# Patient Record
Sex: Male | Born: 1945 | Race: White | Hispanic: No | Marital: Married | Smoking: Former smoker
Health system: Southern US, Community
[De-identification: ages and names within clinical notes are randomized; demographics above are authoritative.]

## PROBLEM LIST (undated history)

## (undated) DIAGNOSIS — I739 Peripheral vascular disease, unspecified: Secondary | ICD-10-CM

## (undated) DIAGNOSIS — I209 Angina pectoris, unspecified: Secondary | ICD-10-CM

## (undated) DIAGNOSIS — M1990 Unspecified osteoarthritis, unspecified site: Secondary | ICD-10-CM

## (undated) DIAGNOSIS — I10 Essential (primary) hypertension: Secondary | ICD-10-CM

## (undated) DIAGNOSIS — K219 Gastro-esophageal reflux disease without esophagitis: Secondary | ICD-10-CM

## (undated) HISTORY — PX: JOINT REPLACEMENT: SHX530

## (undated) HISTORY — PX: HERNIA REPAIR: SHX51

## (undated) HISTORY — PX: APPENDECTOMY: SHX54

---

## 1980-02-22 HISTORY — PX: FOOT SURGERY: SHX648

## 1983-02-22 HISTORY — PX: FOOT SURGERY: SHX648

## 2000-03-16 ENCOUNTER — Encounter: Admission: RE | Admit: 2000-03-16 | Discharge: 2000-04-06 | Payer: Self-pay | Admitting: Neurosurgery

## 2003-01-09 ENCOUNTER — Inpatient Hospital Stay (HOSPITAL_COMMUNITY): Admission: EM | Admit: 2003-01-09 | Discharge: 2003-01-10 | Payer: Self-pay

## 2006-04-26 ENCOUNTER — Ambulatory Visit: Payer: Self-pay | Admitting: Cardiology

## 2006-04-26 ENCOUNTER — Observation Stay (HOSPITAL_COMMUNITY): Admission: EM | Admit: 2006-04-26 | Discharge: 2006-04-27 | Payer: Self-pay | Admitting: Emergency Medicine

## 2006-05-15 ENCOUNTER — Ambulatory Visit: Payer: Self-pay | Admitting: Cardiology

## 2006-06-27 ENCOUNTER — Ambulatory Visit: Payer: Self-pay | Admitting: Cardiology

## 2007-12-28 ENCOUNTER — Ambulatory Visit: Payer: Self-pay | Admitting: Cardiology

## 2007-12-29 ENCOUNTER — Encounter: Payer: Self-pay | Admitting: Cardiology

## 2008-05-19 ENCOUNTER — Ambulatory Visit: Payer: Self-pay | Admitting: Cardiology

## 2008-06-04 ENCOUNTER — Ambulatory Visit: Payer: Self-pay | Admitting: Cardiology

## 2008-11-24 DIAGNOSIS — E785 Hyperlipidemia, unspecified: Secondary | ICD-10-CM | POA: Insufficient documentation

## 2008-11-24 DIAGNOSIS — R002 Palpitations: Secondary | ICD-10-CM | POA: Insufficient documentation

## 2008-11-24 DIAGNOSIS — I10 Essential (primary) hypertension: Secondary | ICD-10-CM

## 2010-04-26 ENCOUNTER — Encounter: Payer: BC Managed Care – PPO | Attending: Family Medicine

## 2010-04-26 DIAGNOSIS — Z713 Dietary counseling and surveillance: Secondary | ICD-10-CM | POA: Insufficient documentation

## 2010-04-26 DIAGNOSIS — E119 Type 2 diabetes mellitus without complications: Secondary | ICD-10-CM | POA: Insufficient documentation

## 2010-05-11 ENCOUNTER — Ambulatory Visit: Payer: Medicare Other

## 2010-06-23 ENCOUNTER — Encounter (INDEPENDENT_AMBULATORY_CARE_PROVIDER_SITE_OTHER): Payer: Self-pay | Admitting: Internal Medicine

## 2010-07-06 NOTE — Assessment & Plan Note (Signed)
[facility]                          [facility] NOTE   NAME:[name], [name] STARLIPER                       MRN:          [id number]  DATE:12/28/2007                            DOB:          [birth year]    PRIMARY CARDIOLOGIST:  Learta Codding, MD,FACC   REASON FOR VISIT:  Annual followup.   [name] returns to our clinic since last seen here in May 2008.  He was  doing quite well until these past few weeks when he noted development of  recurrent night sweats, absent of any associated fever, and with no  associated chest discomfort.  During the daytime hours, he also has  noted development of easy fatigability when he is piddling around.  He  has not taken any nitroglycerin tablets for these episodes, which are  correlated with some mild anterior chest tightness.  However, he also  suggests new-onset fluttering in the lower part of his sternum, which  is associated with the daytime episodes of weakness.  He does not note  any such symptoms when he is awakened by these profuse diaphoretic  episodes, however.  He also seems to suggest a progressive nature to  these episodes.  He is diabetic and states that his blood sugar levels  are, if anything, on the high side during these periods.   EKG in our office today indicates an SR with incomplete RBB.   CURRENT MEDICATIONS:  1. Toprol ER 50 daily.  2. Aspirin 325 daily.  3. Glipizide 10 b.i.d.  4. Avandamet 05/998 b.i.d.  5. Lipitor 10 nightly.   PHYSICAL EXAMINATION:  VITAL SIGNS:  Blood pressure 132/78, pulse 68 and  regular, and weight is 207.  GENERAL:  A 65 year old male, moderately obese, sitting upright, in no  distress.  HEENT:  Normocephalic and atraumatic.  NECK:  Palpable bilateral carotid pulses without bruits; no JVD.  LUNGS:  Clear to auscultation in all fields.  HEART:  Regular rate and rhythm.  Positive S4.  No significant murmurs.  No rubs.  ABDOMEN:  Soft and nontender.  Intact  bowel sounds.  EXTREMITIES:  Palpable bilateral femoral pulses with right femoral  bruit, none on the left.  Minimally palpable right dorsalis pedis pulse  with brisk left dorsalis pedis pulse.  No significant pedal edema.  NEUROLOGIC:  No focal deficit.   IMPRESSION:  1. New-onset tachypalpitations.      a.     Associated weakness and chest tightness.  2. Nocturnal diaphoretic episodes.      a.     No associated tachypalpitations/chest tightness.  3. Nonobstructive coronary artery disease.      a.     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  4. Normal left ventricle.  5. Hypertension.  6. Type 2 diabetes mellitus.  7. Dyslipidemia.  8. Remote tobacco.   PLAN:  1. Schedule CardioNet monitoring to exclude new-onset dysrhythmias.  2. Up titrate Toprol to 75 mg daily for possible anti-anginal benefit,      as well as suppression of these  palpitations.  3. Extensive blood work with complete metabolic profile,      CBC/differential, and TSH level.  4. Early clinic followup with myself and Dr. Andee Lineman in 1 month, for      review of study results and further recommendations.      Gene Serpe, PA-C  Electronically Signed      Learta Codding, MD,FACC  Electronically Signed   GS/MedQ  DD: 12/28/2007  DT: 12/29/2007  Job #: [id number]   cc:   Ace Gins, MD

## 2010-07-06 NOTE — Assessment & Plan Note (Signed)
[facility]                          [facility]ICE NOTE   NAME:[name]                       MRN:          [id number]  DATE:05/19/2008                            DOB:          Jul 20, 1945    HISTORY OF PRESENT ILLNESS:  The patient is a pleasant 65 year old male  with a history of tachy palpitations and nonobstructive coronary artery  disease by catheterization in 2008.  The patient had a CardioNet monitor  placed in December 2009, which showed no significant arrhythmias.  The  patient states that up until a week ago, he was doing, Friday, a week  ago, however, he woke up in the middle of the night with chest tightness  and with some pain in the left jaw area.  He was very nauseated and this  lasted until most of the morning.  The patient was not able to go to  work.  He states since this particular episode, he is still drained, and  very weak, and has no exercise tolerance anymore.  On exertion, now also  reports some left-sided neck and shoulder pain.  He has no orthopnea,  PND, palpitations, or syncope.   MEDICATIONS:  1. Toprol ER 50 mg p.o. daily.  2. Aspirin 325 daily.  3. Glipizide 10 mg p.o. b.i.d.  4. Avandamet 1000 mg p.o. b.i.d.  5. Lipitor 40 mg nightly.   PHYSICAL EXAMINATION:  VITAL SIGNS:  Blood pressure 160/85, heart rate  87, respiration 18, and weight 206.  GENERAL:  Well-nourished white male in no apparent distress.  HEENT:  Pupils, eyes are equal.  Conjunctivae clear.  NECK:  Supple.  Normal carotid upstroke with no carotid bruits.  LUNGS:  Clear breath sounds bilaterally.  HEART:  Regular rate and rhythm.  Normal S1 and S2.  No murmur, rubs, or  gallops.  ABDOMEN:  Soft and nontender.  No rebound or guarding.  Good bowel  sounds.  EXTREMITIES:  No cyanosis, clubbing, or edema.  NEUROLOGIC:  The patient is alert and oriented.  Grossly nonfocal.   PROBLEM LIST:  1. Nausea and left neck and shoulder pain.    a.     Rule out ischemic heart disease.      b.     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      c.     Normal left ventricular function.  2. History of tachy palpitations, resolved.  3. History of nocturnal diaphoretic episodes.  4. Hypertension.  5. Type 2 diabetes mellitus.  6. Dyslipidemia.  7. Remote tobacco abuse.   PLAN:  1. The patient has previously been evaluated on CardioNet monitor and      this did not show any dysrhythmias.  He did report in the past      diaphoretic episodes at night and this makes me concern he could      have an angina equivalent.  There is no new change in his  electrocardiogram, however.  2. Given the above, we will schedule the patient however, for an      exercise Cardiolite stress study and given prescription for Imdur      30 mg p.o. daily in the meanwhile.  3. The patient's blood pressure also significantly elevated.  Imdur      should help with this, but this may require further attention.      Also, we will repeat his blood pressure lying down before he      departs to the office.     Learta Codding, MD,FACC  Electronically Signed    GED/MedQ  DD: 05/19/2008  DT: 05/20/2008  Job #: [id number]   cc:   Marjory Lies, M.D.

## 2010-07-09 NOTE — Discharge Summary (Signed)
NAMEPEPE, MINEAU                          ACCOUNT NO.:  [id number]   MEDICAL RECORD NO.:  [id number]                   PATIENT TYPE:  INP   LOCATION:  4732                                 FACILITY:  [facility]   PHYSICIAN:  Rollene Rotunda, M.D.                DATE OF BIRTH:  07-09-1945   DATE OF ADMISSION:  01/09/2003  DATE OF DISCHARGE:  01/10/2003                                 DISCHARGE SUMMARY   PROCEDURE:  1. Cardiac catheterization.  2. Coronary arteriogram.  3. Left ventriculogram.   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  He reports a long history of exertional  chest pain, and at one point had nitroglycerin, but quit carrying this years  before.   On the day before admission, he had two episodes of chest pain, both less  than 30 minutes.  However, he had chest pain which woke him at approximately  2:30 a.m. and was associated with diaphoresis, shortness of breath, and  radiated to his left neck and arm.  It was a 10/10 at the worst, but lasted  less than five minutes and was described as a tightening.  He felt drained  this a.m., but had no chest pain upon waking.  However, the tightness  returned and his wife made him see his primary M.D. who sent him to the  emergency room by EMS.  Upon examination, he had some left arm pain, but no  chest pain.  His symptoms were concerning for unstable anginal pain and he  was admitted to rule out MI and for further evaluation.   His enzymes were negative for MI, but there was concern for unstable anginal  pain, so he was scheduled for cardiac catheterization which was performed on  January 10, 2003.   The cardiac catheterization showed no significant coronary artery disease,  but there was a myocardial bridge to  the LAD seen up to 80% stenosis and  approximately 30 mm.  His EF was greater than 55% with no MR.  Dr. Chales Abrahams  evaluated the films and felt that he needed to be on a beta blocker which  had been started and he needed a Cardiolite on medication.  If symptoms were  significant on the stress test, then he may be considered for minimally  invasive surgery of LIMA to LAD.  He was Perclosed.   Post catheterization, he had no chest pain or shortness of breath and his  Perclose dressing was without any hemorrhage.  There was no ecchymosis  around the catheterization site and no hematoma was noted.  He was  considered stable for discharge on January 10, 2003, and is to follow up as  an outpatient.  LABORATORY DATA:  Hemoglobin A1C is 11.7%.  Total cholesterol 155,  triglycerides 155, HDL 42, LDL 81.  TSH 1.072.  Sodium 137, potassium 4.1,  chloride 102, CO2 29, BUN 19, creatinine 1.3, glucose 308.  Other CMET  values within normal limits.  Hemoglobin 14.9, hematocrit 42.8, WBC 6.4,  platelets 196.   Chest x-ray; suboptimal inspiration, but no active disease.   CONDITION ON DISCHARGE:  Improved.   DISCHARGE DIAGNOSES:  1. Chest pain, possibly secondary to an 80% left anterior descending which     is secondary to a myocardial bridge.  2. Diabetes mellitus, hemoglobin A1C 11.7.  3. Intolerance to Rezulin.  4. Osteoarthritis.  5. History of a bulging disk in his back.  6. Status post right leg and ankle surgery as well as appendectomy and     hernia repair.  7. Family history of congestive heart failure in his father, but no history     of known coronary artery disease.  8. History of palpitations with no significant arrhythmias seen on strips     during this admission.   DISCHARGE INSTRUCTIONS:  1. His activity level is to include no strenuous activity for two days.  He     is to stick to a low fat, diabetic diet.  2. He is to call the office for problems with the catheterization  site.  3. He has a stress test scheduled on December 3, at 8:45 a.m. and is to     follow up with Dr. Antoine Poche on December 8, at 9:30 a.m.  He is to follow     up with Dr. Doristine Counter as needed.   DISCHARGE MEDICATIONS:  1. Aspirin 325 mg daily.  2. Avandamet 4/500 mg b.i.d. restart Sunday, November 21.  3. Univasc 15 mg 1/2 tablet daily.  4. Toprol XL 25 mg daily.  5. Nitroglycerin p.r.n.      Lavella Hammock, P.A. [facility]                  Rollene Rotunda, M.D.    RG/MEDQ  D:  01/10/2003  T:  01/11/2003  Job:  [id number]   cc:   Marjory Lies, M.D.  [address]  [place]  [place] [address]  Fax: [phone]   Rollene Rotunda, M.D.

## 2010-07-09 NOTE — Cardiovascular Report (Signed)
NAMESHIGERU, LAMPERT                ACCOUNT NO.:  [id number]   MEDICAL RECORD NO.:  [id number]          PATIENT TYPE:  INP   LOCATION:  2807                         FACILITY:  [facility]   PHYSICIAN:  Salvadore Farber, MD  DATE OF BIRTH:  January 09, 1946   DATE OF PROCEDURE:  04/26/2006  DATE OF DISCHARGE:                            CARDIAC CATHETERIZATION   PROCEDURE:  Left heart catheterization, left ventriculography, coronary  angiography, pressure wire interrogation of the left anterior descending  artery.   INDICATIONS:  Mr. Beedle is a 65 year old gentleman who underwent  diagnostic angiography approximately five years ago.  That demonstrated  a myocardial bridge with associated systolic compression of the LAD.  He  was prescribed beta blockade.  He did well for a number of years.  However, he has been recently noncompliant with his medical therapy.  He  now presents with an episode of chest discomfort.  Electrocardiogram is  normal.  Troponin was slightly elevated at 0.17.  He was therefore  referred for diagnostic angiography.   PROCEDURAL TECHNIQUE:  Informed consent was obtained.  Under 1%  lidocaine local anesthesia, a 5-French sheath was placed in the right  common femoral artery using the modified Seldinger technique.  Diagnostic angiography and ventriculography were performed using JL-4,  JR-4, and pigtail catheters.  This demonstrated 50% stenosis of the mid-  LAD and then a long 50% stenosis further downstream in the mid-LAD with  associated myocardial bridge.  There were no other significant stenoses.  Due to his syndrome, we decided to proceed with pressure wire  interrogation of the LAD.   To that end, the sheath was upsized over a wire to 6-French.  The  patient had been given Lovenox subcutaneously seven hours prior to the  procedure.  Therefore, no additional anticoagulation was administered.  A 6-French CLS 3.5 guide was advanced over a wire and engaged in the  ostium  of the left main.  The pressure wire was first positioned with  the pressure port just outside the guide.  Pressures were normalized.  I  then advanced the wire well beyond the segment in question, into the  distal LAD.  Two separate measurements of FFR were performed after the  administration of 42 mcg of intracoronary nitroglycerin.  The first  measured 0.82 and the second 0.83.  Wire was then removed as were  catheters.  The patient was then transferred to the holding room in  stable condition having tolerated the procedure well.   COMPLICATIONS:  None.   FINDINGS:  1. LV:  120/12/18.  EF 55% without regional wall motion abnormality.  2. No aortic stenosis or mitral regurgitation.  3. Left main coronary angiographically normal.  4. LAD:  Moderate-sized vessel giving rise to two diagonals.  There is      a 20% stenosis of the ostium of the LAD, a focal 50% stenosis in      the LAD at the site of origin of the diagonals and then a long 50%      stenosis of the mid vessel.  Some, but not all, of this longer  stenosis is associated with myocardial bridging.  The FFR distal to      all this was 0.83.  5. Ramus intermedius:  Small vessel which is angiographically normal.  6. Circumflex:  Moderate-sized vessel giving rise to two marginals.      It is angiographically normal.  7. RCA:  Fairly small though dominant vessel.  It is angiographically      normal.   IMPRESSION/PLAN:  The patient has nonobstructive coronary disease and a  myocardial bridge.  Despite these, the FFR is not significantly  abnormal.  Will treat him with beta blockade and aspirin.      Salvadore Farber, MD  Electronically Signed     WED/MEDQ  D:  04/26/2006  T:  04/26/2006  Job:  [id number]   cc:   Learta Codding, MD,FACC

## 2010-07-09 NOTE — Discharge Summary (Signed)
[name], [name]                ACCOUNT NO.:  [id number]   MEDICAL RECORD NO.:  [id number]          PATIENT TYPE:  OBV   LOCATION:  2010                         FACILITY:  [facility]   PHYSICIAN:  [name], ACNP  DATE OF BIRTH:  Feb 08, 1946   DATE OF ADMISSION:  04/26/2006  DATE OF DISCHARGE:  04/27/2006                               DISCHARGE SUMMARY   DISCHARGING PHYSICIAN:  [name], M.D.   PRIMARY CARDIOLOGIST:  [name], M.D.   PRIMARY CARE PHYSICIAN:  [name], M.D.   DISCHARGING PHYSICIAN:  1. Coronary artery disease status post cardiac catheterization showing      nonobstructive coronary artery disease with an intramyocardial      bridge with systolic compression of the left anterior descending.  2. Non-ST-elevation myocardial infarction this admission with a      troponin of 0.18 at [facility] prior to transfer      here.  3. Borderline diabetes with a glucose of 222 at [facility]      prior to transfer here.  Diabetic management to be followed by Dr.      Doristine [name] outpatient.   PAST MEDICAL HISTORY:  1. Previous cardiac catheterization in 1994 at [facility] which showed no critical CAD at that time but      intramyocardial bridge with systolic compression LAD.  2. A long history of exertional chest pain.  3. Most recent cardiac catheterization in 2004 prior to this      admission.  Ejection fraction at that time was noted to be 55%.      Cardiac catheterization showed no significant coronary artery      disease.  Once again, myocardial bridge to the LAD seen up to 80%      stenosis and approximately 30 mm.  4. Previous diagnosis of diabetes with a hemoglobin A1c of 11.7 in      2004.  5. Intolerance to Rezulin.  6. Osteoarthritis.  7. History of bulging disk.  8. Status post right leg and ankle surgery.  9. Status post appendectomy.  10.Status post hernia repair.  11.History of palpitations for  nonsignificant arrhythmias during      hospitalization.   PROCEDURES THIS ADMISSION:  Cardiac catheterization on April 26, 2006 by  [name].  Results as stated above.   HOSPITAL COURSE:  [name] is a 65 year old gentleman initially  presented to [facility] complaining of chest pain.  The  patient initially stated the chest pain had started on March 4.  They  used nitroglycerin with relief.  Chest discomfort started once again in  the evening with  radiation into his left arm described as heaviness.  They used nitroglycerin with minimal relief of discomfort.  Initial  laboratory work at [facility] showed a troponin of 0.18 and a CK-MB of  2.7.  Arrangements were made for the patient to be transferred to [facility] via [facility].   The patient was seen and noted to be hypotensive, given a fluid bolus,  started on nitroglycerin, and pain free.  The patient to the  catheterization laboratory after seen by [name].  Results as stated  above.  The patient tolerated the procedure without complications.   [name] in to see the patient on the day of discharge.  The patient  afebrile, blood pressure 112/73, no complaints.  Catheterization site  stable.  Hematocrit 40, BUN and creatinine 14 and 1.2, glucose elevated  at 214.  The patient being discharged home with instructions to follow  up with Dr. Andee [name] within the next two weeks.  He is to continue Toprol-  XL 50 mg daily.  The patient has a prescription for this, Lipitor 80 mg  at bedtime, prescription has been given.  Nitroglycerin p.r.n.,  prescription has been given.  He is also to take aspirin 325 mg daily  and follow up with [name] regarding his hyperglycemia with previous  diet control.  However, not therapeutic at this time.  The patient has  also been given the post cardiac catheterization discharge instructions  and medication information following a non-ST-elevation MI.   DURATION OF DISCHARGE ENCOUNTER:   Thirty minutes.      [name], ACNP     MB/MEDQ  D:  04/27/2006  T:  04/27/2006  Job:  [id number]   cc:   [name], M.D.

## 2010-07-09 NOTE — Cardiovascular Report (Signed)
NAME:  [name], [name]                          ACCOUNT NO.:  [id number]   MEDICAL RECORD NO.:  [id number]                   PATIENT TYPE:  INP   LOCATION:  4732                                 FACILITY:  [facility]   PHYSICIAN:  Veneda Melter, M.D.                   DATE OF BIRTH:  [birth year]   DATE OF PROCEDURE:  01/10/2003  DATE OF DISCHARGE:                              CARDIAC CATHETERIZATION   PROCEDURES PERFORMED:  1. Left heart catheterization.  2. Left ventriculogram.  3. Selective coronary.  4. Perclose, right femoral artery.   DIAGNOSES:  1. Intramyocardial bridge, mid left anterior descending.  2. Mild coronary artery disease.  3. Normal left ventricular systolic function.   CARDIOLOGIST:  Veneda Melter, M.D.   HISTORY:  [name] is a 65 year old gentleman who presents with substernal  chest discomfort occurring at rest.  The patient was admitted ot the  hospital, stabilized medically and ruled out for acute myocardial  infarction.  He presents now for further assessment.   TECHNIQUE:  Informed consent was obtained.  The patient was brought to the  catheterization lab.  A 6 French sheath was placed in the right femoral  artery using the modified Seldinger technique.  Six [place] JL-4 and JR-4  catheters were then used to engage the left and right coronary arteries.  Selective coronary angiography was performed in various projections using  manual injection of contrast.  A 6 French pigtail catheter was then advanced  into the left ventricle and left ventriculogram performed using power  injection of contrast.   At the termination of the case catheters and sheaths were removed, and a  Perclose suture closure device deployed to the right femoral artery until  adequate hemostasis was achieved.   The patient tolerated the procedure well and was transferred to the floor in  stable condition.   FINDINGS:  Findings are as follows.  1. Left Main Trunk:  The left main trunk is a  large caliber vessel with mild     irregularities.   1. Left Anterior Descending:  LAD; this begins as a large caliber vessel     and tapers in its midsection, it is then of small caliber as it     approaches the apex.  The LAD provides two diagonal branches in the     proximal segment.  The second diagonal branch has an ostial narrowing of     50%.  The mid LAD then has a long intramyocardial bridge of approximately     30 mm, which appears to compromise the lumen by approximately 70-80%.   1. Left Circumflex Artery:  The left circumflex artery begins as a large     caliber vessel and provides two mg; branches in the midsection.  The     circumflex system has luminal irregularities.   1. Ramus Intermedius:  The ramus intermedius is a small caliber vessel  that     tapers in the mid anterolateral wall.  There is an ostial narrowing of     70%.  This is a small caliber vessel.   1. Right Coronary Artery:  The right coronary artery is dominant small     caliber vessel that provides a diminutive posterior descending artery in     its terminal segment.  The right coronary has mild irregularities.   1. Left Ventricle:  Normal end-systolic and end-diastolic dimensions.     Overall left ventricular function is well preserved.  Ejection fraction     is greater than 55%.  No mitral regurgitation.  LV pressure is 90/10.     Aortic is 90/70.  LVEDP is 15.   ASSESSMENT/PLAN:  [name] is a 65 year old gentleman with mild coronary  artery disease.  He has a significant intramyocardial bridge in the mid left  anterior descending with overall well preserved left ventricular function.   Aggressive medical therapy will be pursued with beta blockers and calcium  channel blocker.  Stress imaging study on maximal therapy may be beneficial  to determine if he has any ischemia in the anterior or apical region.  If he  continues to have symptoms and demonstrable ischemia surgical  revascularization will  be indicated, otherwise we will continue medical  therapy.                                               Veneda Melter, M.D.    NG/MEDQ  D:  01/10/2003  T:  01/10/2003  Job:  [id number]   cc:   Evelena Peat, M.D.  [address]  [place]  [place] [address]  Fax: [phone]   Rollene Rotunda, M.D.

## 2010-07-09 NOTE — Assessment & Plan Note (Signed)
[facility]                          [facility] NOTE   NAME:WEBBMajd, Tissue                       MRN:          [id number]  DATE:06/27/2006                            DOB:          Apr 19, 1945    REFERRING PHYSICIAN:  Marjory Lies, M.D.   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  The patient underwent recent cardiac catheterization and was found  to have nonobstructive coronary artery disease but with a myocardial  bridge.  FFR was, however, within normal limits.  The patient was  continued on beta blocker therapy and aspirin.  He said he still has  occasional chest pains which are very rare and do occur on occasion with  exertion.  He has had no resting symptoms.  In the interim he also  underwent pulmonary function studies which were entirely within normal  limits.  Also ABI tests of the lower extremities were within normal  limits and the patient has no significant peripheral vascular disease.  Overall, he seems to be doing quite well.   MEDICATIONS:  Toprol ER 50 mg p.o. daily.  Aspirin 325 mg p.o. daily.  Lipitor 80 mg p.o. nightly.  Lisinopril 10 mg p.o. daily.  Glipizide 10 mg p.o. b.i.d.  Avandamet 4 mg/1000 mg p.o. daily.   PHYSICAL EXAMINATION:  VITAL SIGNS:  Blood pressure 103/64.  Heart rate  71 beats per minute.  Weight 211 pounds.  NECK EXAM:  Normal carotid upstrokes.  No carotid bruits.  LUNGS:  Clear breath sounds bilaterally.  HEART:  Regular rate and rhythm.  Normal S1, S2.  No murmurs, rubs, or  gallops.  ABDOMEN:  Soft and nontender with no rebound or guarding.  Good bowel  sounds.  EXTREMITY EXAM:  No cyanosis, clubbing, or edema.  NEURO:  The patient is alert, oriented, and grossly nonfocal.   PROBLEM LIST:  1. Myocardial bridge with systolic compression.  2. No significant coronary artery disease.  3. Normal left ventricular function.  4. Diabetes mellitus.  5. Dyslipidemia.  6. Bilateral lower extremity pain but without any significant vascular      disease.  7. Mild dyspnea on exertion.  8. Ex-tobacco smoker.   PLAN:  1. The patient overall seems to be doing quite well.  He still has      occasional brief chest pains which lures off with nitroglycerine      and appears to occur on exertion.  This may well be secondary to      systolic compression on exertion secondary to myocardial bridge.      However, I reassured the patient about these symptoms and told him      at this point no further intervention is required.  2. The patient will followup with [place] in six months.  I also explained      to him the results of his ABI and pulmonary function studies.     Learta Codding, MD,FACC     GED/MedQ  DD: 06/27/2006  DT:  06/27/2006  Job #: [id number]   cc:   Marjory Lies, M.D.  Ace Gins, MD

## 2010-07-09 NOTE — H&P (Signed)
NAME:  [name], [name]                          ACCOUNT NO.:  [id number]   MEDICAL RECORD NO.:  [id number]                   PATIENT TYPE:  INP   LOCATION:  1826                                 FACILITY:  [facility]   PHYSICIAN:  Rollene Rotunda, M.D.                DATE OF BIRTH:  07-21-1945   DATE OF ADMISSION:  01/09/2003  DATE OF DISCHARGE:                                HISTORY & PHYSICAL   REASON FOR PRESENTATION:  Patient with arm and chest discomfort.   HISTORY OF PRESENT ILLNESS:  The patient is a pleasant 65 year old gentleman  with history of chest discomfort.  He had a catheterization apparently 10  years ago at [facility].  This was apparently free of obstructive coronary  disease.  He also had chest pain two years ago and reports being admitted at  [facility].  He describes and exercise perfusion study which apparently was  normal.   He says he intermittently gets chest discomfort which apparently is mild.  He reports that he has had chest discomfort starting yesterday while he was  sitting in a swing with his grandson.  This was sharp and tight.  It  radiated across his chest, up to his neck and up to his arm.  It was  intense.  It lasted for four to five minutes.  He had another episode at  rest later in the evening.  The last episode awoke him at 2:30 in the  morning.  He described it as 10 out of 10.  He was nauseated but had no  vomiting.  He was diaphoretic.  He did not described shortness of breath,  paroxysmal nocturnal dyspnea or orthopnea.  He has had some residual arm  discomfort since that time.  He presented to Dr. Caryl Never where he was not  noted to have any EKG changes.  However, because of the symptoms and his  risk factors he is referred for further evaluation.   He is active.  The most exerting thing he does is ______. He says he was  able to do that Tuesday without any symptoms.  He has not had any new  shortness of breath.  He has had no palpitations,  presyncope or syncope.   PAST MEDICAL HISTORY:  1. Diabetes mellitus times years.  2. Osteoarthritis.  3. Bulging disk in his back.   PAST SURGICAL HISTORY:  Right leg and ankle surgery, appendectomy, hernia  repair.   ALLERGIES:  REZULIN.   MEDICATIONS:  1. Avandamet 400/500 b.i.d.  2. Univasc 15 mg daily.  3. Aspirin 325 mg daily.   SOCIAL HISTORY:  The patient lives in [place] with his wife.  He is a Merchandiser, retail.  He has three children and two grandchildren.  He quit smoking in  1990 after approximately a 20 pack year history.  He does not drink alcohol.   FAMILY HISTORY:  Noncontributory for early coronary artery  disease.  His  mother died at 37 of congestive heart failure.   REVIEW OF SYSTEMS:  Positive for weakness just today, chronic back pain,  joint pains.  A 25 pound weight loss for unclear reasons.  Otherwise  negative for all other systems.   PHYSICAL EXAMINATION:  GENERAL:  The patient is in no distress.  VITAL SIGNS:  Blood pressure 124/77, heart rate 82 and regular, afebrile.  HEENT:  Eyes unremarkable, pupils are equal, round and reactive to light,  fundi not visualized.  Oral mucosa unremarkable.  NECK:  No jugular venous distension.  Wave form within normal limits,  carotid upstrokes brisk and symmetric, no bruits, thyromegaly.  LYMPHATICS:  No cervical, axillary, inguinal adenopathy.  LUNGS:  Clear to auscultation bilaterally.  BACK:  No costovertebral angle tenderness.  CHEST:  Unremarkable.  HEART:  The PMI is not displaced or sustained, S1 and S2 within normal  limits, no S3, no S4, no murmurs.  ABDOMEN:  Obese, positive bowel sounds, normal in frequency and pitch, no  bruits, rebound, guarding, no midline pulse, no hepatomegaly, no  splenomegaly.  SKIN:  No rashes, no nodules.  EXTREMITIES:  There are 2+ pulses throughout, no edema, no cyanosis, no  clubbing.  NEUROLOGIC:  Oriented to person, place and time.  Cranial nerves II-XII  grossly  intact, motor grossly intact.   LABORATORY AND ACCESSORY DATA:  EKG sinus rhythm, rate 78, axis within  normal limits, intervals within normal limits, no acute ST, T wave changes.   WBC 5.9 thousand, hemoglobin 16.5, platelets 225,000.  INR 0.9.  Sodium 137,  potassium 4.1, BUN 19, creatinine 1.3, AST 23, ALT 22.  CK 127, magnesium  2.1, troponin less than 0.01.   ASSESSMENT AND PLAN:  1. The patient's chest discomfort is worrisome for unstable angina.  He does     have significant cardiovascular risk factors.  He is having some residual     discomfort.  He will be admitted to the hospital on continued rule out     for myocardial infarction.  Will use IV heparin, IV nitroglycerin, beta     blockers and aspirin.  Will have elective cardiac catheterization unless     a more urgent one is indicated.  2. Hyperlipidemia.  3. Risk reduction, will check a lipid profile and have a low threshold for     his statin given his diabetes.  4. Hypertension, he will continue medications as listed with the initiation     of beta blocker as well.  5. Diabetes, will have sliding scale Insulin.  Will hold the metformin     component of his Avandamet pending cardiac catheterization.  6. Weight, he will be counseled about the need for weight loss with diet and     exercise.                                                Rollene Rotunda, M.D.    JH/MEDQ  D:  01/09/2003  T:  01/10/2003  Job:  [id number]   cc:   Evelena Peat, M.D.  [address]  [place]  [place] [address]  Fax: [phone]

## 2010-07-09 NOTE — Assessment & Plan Note (Signed)
[facility]                          [facility] NOTE   NAME:[name], [name]                       MRN:          [id number]  DATE:05/15/2006                            DOB:          12/15/1945    CARDIOLOGIST:  He will be new to Dr. Lewayne Bunting.   PRIMARY CARE PHYSICIAN:  Dr. Marjory Lies.   HISTORY OF PRESENT ILLNESS:  Mr. Hackler is a very pleasant 65 year old  male patient who has known intra-myocardial bridging associated with  systolic compression of the LAD by diagnostic angiography approximately  5 years ago.  He was prescribed beta blockade and did well for a number  of years.  He recently had been noncompliant with his medication and  presented to [facility] with an episode of chest discomfort.  His  troponin level went up slightly at 0.17 and he was transferred to [facility] for further evaluation.  At [facility] he underwent  cardiac catheterization by Dr. Samule Ohm on March 5.  This revealed EF of  55% without wall motion abnormality.  His LAD had a 20% stenosis in the  ostium, 50% at the site of the origin of the diagonals, and a long 50%  stenosis of the mid-vessel.  Some, but not all of this longer stenosis  was associated with myocardial bridging.  The FFR distal to the stenosis  was 0.83.  His ramus intermedius, circumflex, and RCA were all normal.  Dr. Samule Ohm noted that despite the nonobstructive disease and myocardial  bridging in LAD, the FFR was not significantly abnormal.  He recommended  treating with beta blockade and aspirin.   He returns to the office today for followup.  Overall he is doing well.  He does note that he gets chest discomfort when he gets upset.  This  happened the other day, he left the situation, and his symptoms  resolved.  He notes no chest discomfort with exertion.  He does note  shortness of breath with exertion.  This is probably over the last 6  months.  Denies any  significant wheezing.  He does note a past history  of smoking.  He does note some occasional lower extremity pain with  exertion.  This is somewhat atypical as he does have significant back  problems.  He also does note some significant pain after sitting for a  while and then getting up.  He denies any syncope.  Denies any orthopnea  or paroxysmal nocturnal dyspnea.  Denies any lower extremity edema.   CURRENT MEDICATIONS:  1. Toprol XL 50 mg daily.  2. Aspirin 325 mg daily.  3. Lipitor 80 mg nightly.  4. Nitroglycerin p.r.n. chest pain.   ALLERGIES:  NO KNOWN DRUG ALLERGIES.   PHYSICAL EXAMINATION:  He is a well-nourished, well-developed male in no  distress.  Blood pressure 140/80, pulse 77, weight 201.4 pounds.  HEENT:  Unremarkable.  NECK:  Without JVD.  CAROTIDS:  Without bruits bilaterally.  CARDIO:  Normal S1, S2, regular rhythm without murmurs.  LUNGS:  Clear to auscultation bilaterally without wheezing, rhonchi, or  rales.  ABDOMEN:  Soft, nontender with normoactive bowel sounds, no  organomegaly.  EXTREMITIES:  Without edema, calves are soft, nontender.  Right femoral  arteriotomy site without hematoma or bruit.  Bilateral femoral artery  pulses are 2+ bilaterally without bruits.  Popliteal pulses 1-2+  bilaterally.  Distal pulses somewhat diminished.  NEUROLOGIC:  He is alert and oriented x3, cranial nerves II-XII grossly  intact.  ELECTROCARDIOGRAM:  Reveals sinus rhythm with a heart rate of 76, normal  axis, no acute changes.   IMPRESSION:  1. Nonobstructive coronary artery disease as outlined above with      intramyocardial bridging of the LAD with systolic compression.      a.     Recent presentation with acute coronary syndrome with       troponin rising to 0.17 - medical therapy with beta blockade and       aspirin therapy.  2. Good left ventricular function.  3. Diabetes mellitus, untreated.  4. Dyslipidemia, treated.  5. Degenerative disk disease.  6.  Bilateral lower extremity pain.  7. Dyspnea on exertion.  8. Ex-smoker.   PLAN:  The patient presents to the office today for post hospitalization  followup.  He did have one episode of chest pain post discharge that was  associated with significant stress.  Otherwise he has had no further  chest pain.  Dr. Melinda Crutch recommendation was to proceed with beta  blockade therapy.  His heart rate and blood pressure could tolerate an  increase of his Toprol dose and I have recommended we go up to 75 mg a  day.  He also does note some bilateral lower extremity limb pain.  This  would be atypical for claudication, but since he does have some vascular  disease we should go ahead and proceed with ABI testing.  That will be  arranged.  He also does note some shortness of breath with exertion.  His LV function is normal.  He is optivolemic on exam.  He is not having  any symptoms of congestive heart failure.  He did not have any  significant obstructive coronary disease on catheterization.  He used to  smoke cigarettes.  I have recommended we set him up for pulmonary  function test and have him follow up with his primary care physician.  I  suspect he may have some underlying lung disease that is  contributing to his shortness of breath.  Will get lipids and liver  tests in 6-8 weeks to followup on his dyslipidemia and have him follow  up in this office in about 6 weeks.      Tereso Newcomer, PA-C  Electronically Signed      Learta Codding, MD,FACC  Electronically Signed   SW/MedQ  DD: 05/15/2006  DT: 05/15/2006  Job #: [id number]   cc:   Marjory Lies, M.D.

## 2010-07-15 ENCOUNTER — Ambulatory Visit: Payer: Medicare Other

## 2010-08-12 ENCOUNTER — Encounter (HOSPITAL_BASED_OUTPATIENT_CLINIC_OR_DEPARTMENT_OTHER): Payer: BC Managed Care – PPO | Admitting: Internal Medicine

## 2010-08-12 ENCOUNTER — Ambulatory Visit: Payer: Medicare Other

## 2010-08-12 ENCOUNTER — Other Ambulatory Visit (INDEPENDENT_AMBULATORY_CARE_PROVIDER_SITE_OTHER): Payer: Self-pay | Admitting: Internal Medicine

## 2010-08-12 ENCOUNTER — Ambulatory Visit (HOSPITAL_COMMUNITY)
Admission: RE | Admit: 2010-08-12 | Discharge: 2010-08-12 | Disposition: A | Discharge status: Home / Self Care | Payer: BC Managed Care – PPO | Source: Ambulatory Visit | Attending: Internal Medicine | Admitting: Internal Medicine

## 2010-08-12 DIAGNOSIS — D126 Benign neoplasm of colon, unspecified: Secondary | ICD-10-CM | POA: Insufficient documentation

## 2010-08-12 DIAGNOSIS — E119 Type 2 diabetes mellitus without complications: Secondary | ICD-10-CM | POA: Insufficient documentation

## 2010-08-12 DIAGNOSIS — Z1211 Encounter for screening for malignant neoplasm of colon: Secondary | ICD-10-CM | POA: Insufficient documentation

## 2010-08-12 DIAGNOSIS — Z79899 Other long term (current) drug therapy: Secondary | ICD-10-CM | POA: Insufficient documentation

## 2010-08-12 DIAGNOSIS — Z794 Long term (current) use of insulin: Secondary | ICD-10-CM | POA: Insufficient documentation

## 2010-08-12 DIAGNOSIS — E785 Hyperlipidemia, unspecified: Secondary | ICD-10-CM | POA: Insufficient documentation

## 2010-08-12 DIAGNOSIS — I10 Essential (primary) hypertension: Secondary | ICD-10-CM | POA: Insufficient documentation

## 2010-08-12 LAB — GLUCOSE, CAPILLARY: Glucose-Capillary: 129 mg/dL — ABNORMAL HIGH (ref 70–99)

## 2010-08-13 ENCOUNTER — Emergency Department (HOSPITAL_COMMUNITY): Payer: BC Managed Care – PPO

## 2010-08-13 ENCOUNTER — Emergency Department (HOSPITAL_COMMUNITY)
Admission: EM | Admit: 2010-08-13 | Discharge: 2010-08-13 | Disposition: A | Discharge status: Home / Self Care | Payer: BC Managed Care – PPO | Attending: Emergency Medicine | Admitting: Emergency Medicine

## 2010-08-13 DIAGNOSIS — E119 Type 2 diabetes mellitus without complications: Secondary | ICD-10-CM | POA: Insufficient documentation

## 2010-08-13 DIAGNOSIS — Z7982 Long term (current) use of aspirin: Secondary | ICD-10-CM | POA: Insufficient documentation

## 2010-08-13 DIAGNOSIS — I2510 Atherosclerotic heart disease of native coronary artery without angina pectoris: Secondary | ICD-10-CM | POA: Insufficient documentation

## 2010-08-13 DIAGNOSIS — Z794 Long term (current) use of insulin: Secondary | ICD-10-CM | POA: Insufficient documentation

## 2010-08-13 DIAGNOSIS — I10 Essential (primary) hypertension: Secondary | ICD-10-CM | POA: Insufficient documentation

## 2010-08-13 DIAGNOSIS — Z79899 Other long term (current) drug therapy: Secondary | ICD-10-CM | POA: Insufficient documentation

## 2010-08-13 DIAGNOSIS — R112 Nausea with vomiting, unspecified: Secondary | ICD-10-CM | POA: Insufficient documentation

## 2010-08-13 LAB — COMPREHENSIVE METABOLIC PANEL
ALT: 15 U/L (ref 0–53)
AST: 17 U/L (ref 0–37)
Albumin: 3.2 g/dL — ABNORMAL LOW (ref 3.5–5.2)
Alkaline Phosphatase: 89 U/L (ref 39–117)
BUN: 19 mg/dL (ref 6–23)
CO2: 28 mEq/L (ref 19–32)
Calcium: 8.4 mg/dL (ref 8.4–10.5)
Chloride: 104 mEq/L (ref 96–112)
Creatinine, Ser: 1.36 mg/dL — ABNORMAL HIGH (ref 0.50–1.35)
GFR calc Af Amer: >60 mL/min (ref >60)
GFR calc non Af Amer: 52 mL/min — ABNORMAL LOW (ref >60)
Glucose, Bld: 159 mg/dL — ABNORMAL HIGH (ref 70–99)
Potassium: 4.1 mEq/L (ref 3.5–5.1)
Sodium: 139 mEq/L (ref 135–145)
Total Bilirubin: 0.5 mg/dL (ref 0.3–1.2)
Total Protein: 7.2 g/dL (ref 6.0–8.3)

## 2010-08-13 LAB — CK TOTAL AND CKMB (NOT AT ARMC)
CK, MB: 2 ng/mL (ref 0.3–4.0)
CK, MB: 1.9 ng/mL (ref 0.3–4.0)
Relative Index: 0.8 (ref 0.0–2.5)
Relative Index: 0.8 (ref 0.0–2.5)
Total CK: 239 U/L — ABNORMAL HIGH (ref 7–232)
Total CK: 245 U/L — ABNORMAL HIGH (ref 7–232)

## 2010-08-13 LAB — LIPASE, BLOOD: Lipase: 34 U/L (ref 11–59)

## 2010-08-13 LAB — DIFFERENTIAL
Basophils Absolute: 0 10*3/uL (ref 0.0–0.1)
Basophils Relative: 0 % (ref 0–1)
Eosinophils Absolute: 0.1 10*3/uL (ref 0.0–0.7)
Eosinophils Relative: 1 % (ref 0–5)
Lymphocytes Relative: 11 % — ABNORMAL LOW (ref 12–46)
Lymphs Abs: 1.5 10*3/uL (ref 0.7–4.0)
Monocytes Absolute: 0.9 10*3/uL (ref 0.1–1.0)
Monocytes Relative: 7 % (ref 3–12)
Neutro Abs: 10.6 10*3/uL — ABNORMAL HIGH (ref 1.7–7.7)
Neutrophils Relative %: 81 % — ABNORMAL HIGH (ref 43–77)

## 2010-08-13 LAB — CBC
HCT: 44.3 % (ref 39.0–52.0)
Hemoglobin: 15.5 g/dL (ref 13.0–17.0)
MCH: 32 pg (ref 26.0–34.0)
MCHC: 35 g/dL (ref 30.0–36.0)
MCV: 91.5 fL (ref 78.0–100.0)
Platelets: 230 10*3/uL (ref 150–400)
RBC: 4.84 MIL/uL (ref 4.22–5.81)
RDW: 12.2 % (ref 11.5–15.5)
WBC: 13.1 10*3/uL — ABNORMAL HIGH (ref 4.0–10.5)

## 2010-08-13 LAB — TROPONIN I
Troponin I: <0.3 ng/mL (ref <0.30)
Troponin I: <0.3 ng/mL (ref <0.30)

## 2010-08-31 NOTE — Op Note (Signed)
[name], [name]                ACCOUNT NO.:  [id number]  MEDICAL RECORD NO.:  [id number]  LOCATION:  DAYP                          FACILITY:  [facility]  PHYSICIAN:  Lionel December, M.D.    DATE OF BIRTH:  [birth year]  DATE OF PROCEDURE:  08/12/2010 DATE OF DISCHARGE:                              OPERATIVE REPORT   PROCEDURE:  Colonoscopy with polypectomy which was incomplete.  INDICATION:  [name] is a 65 year old Caucasian male who is undergoing average risk screening colonoscopy.  Procedure risks were reviewed with the patient.  Informed consent was obtained.  MEDICATIONS FOR CONSCIOUS SEDATION:  Demerol 50 mg IV and Versed 6 mg IV.  FINDINGS:  Procedure performed in endoscopy suite.  The patient's vital signs and O2 sat were monitored during the procedure and remained stable.  The patient was placed in left recumbent lateral position and rectal examination was performed.  No abnormality on external or digital exam.  Pentax videoscope was placed through rectum and advanced under vision into sigmoid colon and beyond.  Preparation in the distal half of colon was satisfactory.  He had lot of thick liquid stool in the proximal half of the colon.  Scope was passed into cecum which was identified by appendiceal orifice and ileocecal valve.  Pictures were taken for the record.  As the scope was withdrawn, colonic mucosa was carefully examined after vigorous washing.  In the region of hepatic flexure/distal descending colon, there were three small polyps, two of which were ablated via cold biopsy and submitted in one container.  The third polyp was coagulated using snare tip.  Just above the small polyps, there was over 15 mm sessile polyp with the lateral warm like extensions.  This polyp approach, this polyp initially was excellent. Saline was injected to elevate this polyp.  As I proceeded with snare polypectomy, a small piece was snared with loss.  Thereafter I could never get a  good approach to it and lot of liquid stool kept coming and changing the patient's position did not make any difference.  I did take a biopsy from this polyp.  Polypectomy could not be completed. Endoscope was withdrawn.  Mucosa and rest of the colon was normal. Rectal mucosa was normal.  Scope was retroflexed to examine anorectal junction which was unremarkable.  Endoscope was withdrawn.  Withdrawal time was close to 40 minutes.  The patient tolerated the procedure well.  FINAL DIAGNOSES: 1. Examination performed to cecum. 2. Two small polyps ablated via cold biopsy from hepatic flexure and     submitted in one container.  There were small polyps that was     coagulated using snare tip. 3. In this area, there was a large sessile polyp with lateral wound     like extensions.  Because of quality of prep and peristalsis, I     could not do a complete polypectomy.  RECOMMENDATIONS: 1. No aspirin for 1 week. 2. I will be contacting the patient results of biopsy and plan to     bring him back within the next 8-12 weeks and we will give him to 2-     day prep.  ______________________________ Lionel December, M.D.     NR/MEDQ  D:  08/12/2010  T:  08/13/2010  Job:  [id number]  Electronically Signed by Lionel December M.D. on 08/31/2010 08:15:17 PM

## 2010-09-09 ENCOUNTER — Ambulatory Visit: Payer: Medicare Other

## 2010-10-26 ENCOUNTER — Encounter (INDEPENDENT_AMBULATORY_CARE_PROVIDER_SITE_OTHER): Payer: Self-pay | Admitting: *Deleted

## 2010-12-24 ENCOUNTER — Other Ambulatory Visit (INDEPENDENT_AMBULATORY_CARE_PROVIDER_SITE_OTHER): Payer: Self-pay | Admitting: *Deleted

## 2010-12-24 ENCOUNTER — Encounter (INDEPENDENT_AMBULATORY_CARE_PROVIDER_SITE_OTHER): Payer: Self-pay | Admitting: *Deleted

## 2010-12-24 DIAGNOSIS — Z86010 Personal history of colonic polyps: Secondary | ICD-10-CM

## 2010-12-24 NOTE — Telephone Encounter (Signed)
This encounter was created in error - please disregard.

## 2011-01-11 ENCOUNTER — Telehealth (INDEPENDENT_AMBULATORY_CARE_PROVIDER_SITE_OTHER): Payer: Self-pay | Admitting: *Deleted

## 2011-01-11 NOTE — Telephone Encounter (Signed)
Agree with surveillance colonoscopy 

## 2011-01-11 NOTE — Telephone Encounter (Signed)
PCP/Requesting MD:    Name: ZAIDIN BLYDEN  DOB: 01/19/1946  Home Phone: [phone]      Procedure: TCS  Reason/Indication:  SURVEILLANCE, HX POLYPS  Has patient had this procedure before?  YES  If so, when, by whom and where?  07/2010 (PER NUR REPEAT 6 [facility])  Is there a family history of colon cancer?  NO  Who?  What age when diagnosed?    Is patient diabetic?   YES      Does patient have prosthetic heart valve?  NO  Do you have a pacemaker?  NO  Has patient had joint replacement within last 12 months?  NO  Is patient on Coumadin, Plavix and/or Aspirin? YES  Medications: ASA 325 MG DAILY, LANTUS 40 UNITS IN AM & 40 UNITS IN PM, GLIPIZIDE 10 MG BID, METOPROLOL 50 MG DAILY, LIPITOR 80 MG DAILY  Allergies: NKDA  Pharmacy:   Medication Adjustment: ASA 2 DAYS, LANTUS 30 UNITS IN AM & 20 UNITS IN PM, HOLD GLIPIZIDE DAY BEFORE (THESE ARE PER DR [facility])  Procedure date & time: 01/19/11 @ 9:30

## 2011-01-12 ENCOUNTER — Encounter (HOSPITAL_COMMUNITY): Payer: Self-pay | Admitting: Pharmacy Technician

## 2011-01-18 MED ORDER — SODIUM CHLORIDE 0.45 % IV SOLN
Freq: Once | INTRAVENOUS | Status: AC
  Administered 2011-01-19: 09:00:00 via INTRAVENOUS

## 2011-01-19 ENCOUNTER — Other Ambulatory Visit (INDEPENDENT_AMBULATORY_CARE_PROVIDER_SITE_OTHER): Payer: Self-pay | Admitting: Internal Medicine

## 2011-01-19 ENCOUNTER — Ambulatory Visit (HOSPITAL_COMMUNITY)
Admission: RE | Admit: 2011-01-19 | Discharge: 2011-01-19 | Disposition: A | Discharge status: Home / Self Care | Payer: BC Managed Care – PPO | Source: Ambulatory Visit | Attending: Internal Medicine | Admitting: Internal Medicine

## 2011-01-19 ENCOUNTER — Encounter (HOSPITAL_COMMUNITY)
Admission: RE | Disposition: A | Discharge status: Home / Self Care | Payer: Self-pay | Source: Ambulatory Visit | Attending: Internal Medicine

## 2011-01-19 ENCOUNTER — Encounter (HOSPITAL_COMMUNITY): Payer: Self-pay

## 2011-01-19 DIAGNOSIS — Z860100 Personal history of colon polyps, unspecified: Secondary | ICD-10-CM | POA: Insufficient documentation

## 2011-01-19 DIAGNOSIS — E119 Type 2 diabetes mellitus without complications: Secondary | ICD-10-CM | POA: Insufficient documentation

## 2011-01-19 DIAGNOSIS — Z01812 Encounter for preprocedural laboratory examination: Secondary | ICD-10-CM | POA: Insufficient documentation

## 2011-01-19 DIAGNOSIS — Z09 Encounter for follow-up examination after completed treatment for conditions other than malignant neoplasm: Secondary | ICD-10-CM

## 2011-01-19 DIAGNOSIS — Z79899 Other long term (current) drug therapy: Secondary | ICD-10-CM | POA: Insufficient documentation

## 2011-01-19 DIAGNOSIS — I10 Essential (primary) hypertension: Secondary | ICD-10-CM | POA: Insufficient documentation

## 2011-01-19 DIAGNOSIS — D126 Benign neoplasm of colon, unspecified: Secondary | ICD-10-CM

## 2011-01-19 DIAGNOSIS — Z860101 Personal history of adenomatous and serrated colon polyps: Secondary | ICD-10-CM

## 2011-01-19 HISTORY — PX: COLONOSCOPY: SHX5424

## 2011-01-19 HISTORY — DX: Essential (primary) hypertension: I10

## 2011-01-19 LAB — GLUCOSE, CAPILLARY: Glucose-Capillary: 172 mg/dL — ABNORMAL HIGH (ref 70–99)

## 2011-01-19 SURGERY — COLONOSCOPY
Anesthesia: Moderate Sedation

## 2011-01-19 MED ORDER — STERILE WATER FOR IRRIGATION IR SOLN
Status: DC | PRN
  Administered 2011-01-19: 10:00:00

## 2011-01-19 MED ORDER — MEPERIDINE HCL 50 MG/ML IJ SOLN
INTRAMUSCULAR | Status: DC | PRN
  Administered 2011-01-19 (×2): 25 mg via INTRAVENOUS

## 2011-01-19 MED ORDER — MIDAZOLAM HCL 5 MG/5ML IJ SOLN
INTRAMUSCULAR | Status: DC | PRN
  Administered 2011-01-19: 2 mg via INTRAVENOUS
  Administered 2011-01-19: 1 mg via INTRAVENOUS
  Administered 2011-01-19: 2 mg via INTRAVENOUS

## 2011-01-19 MED ORDER — MIDAZOLAM HCL 5 MG/5ML IJ SOLN
INTRAMUSCULAR | Status: AC
  Filled 2011-01-19: qty 10

## 2011-01-19 MED ORDER — MEPERIDINE HCL 50 MG/ML IJ SOLN
INTRAMUSCULAR | Status: AC
  Filled 2011-01-19: qty 1

## 2011-01-19 NOTE — Op Note (Signed)
COLONOSCOPY PROCEDURE REPORT  PATIENT:  [name]  MR#:  [id number] Birthdate:  [birth year], 65 y.o., male Endoscopist:  Dr. Malissa Hippo, MD Referred By:  Dr. Delorse Lek, MD Procedure Date: 01/19/2011  Procedure:   Colonoscopy with snare polypectomy and argon plasma coagulation(APC) of residual polyp.  Indications: Patient is 65 year old Caucasian male who underwent screening colonoscopy in June 2012. He had sessile polyp in the region of hepatic flexurewhich could  not be removed because of poor prep. This polyp was a tubular adenoma. He is now returning for therapeutic colonoscopy.  Informed Consent:  Procedure and risks were reviewed with the patient and informed consent was obtained. Medications:  Demerol 50 mg IV Versed 5 mg IV  Description of procedure:  After a digital rectal exam was performed, that colonoscope was advanced from the anus through the rectum and colon to the area of the cecum. Appendiceal orifice was is well seen on previous exam and not on today's exam. From the level of the cecum, the scope was slowly and cautiously withdrawn. The mucosal surfaces were carefully surveyed utilizing scope tip to flexion to facilitate fold flattening as needed. The scope was pulled down into the rectum where a thorough exam including retroflexion was performed.  Findings:   Prep satisfactory. 5-6 mm polyp snared from hepatic flexure. Large sessile polyp about 3 cm long with irregular shaped. Polyp was elevated with saline injection and piecemeal polypectomy performed. This polyp was right at the bend and therefore difficult to catch with the snare. Residual polyp coagulated with APC. Another 8 x 12 mm  broad-based polyp at proximal transverse colon. Saline-assisted polypectomy performed. Part of the polyp was lost but a piece was retrieved for histologic exam. Rest of the mucosa was normal. Anorectal junction unremarkable.  Therapeutic/Diagnostic Maneuvers Performed:  See  above  Complications:  None  Cecal Withdrawal Time:  40 minutes  Impression:  Large sessile polyp at hepatic flexure. Piecemeal polypectomy performed following saline injection. Residual polyp treated with argon plasma coagulator. Small polyp snared from hepatic flexure. 8 x12 mm broad-based polyp snared from proximal transverse colon following saline injection.   Recommendations:  Standard instructions given. I will be contacting patient with results of biopsy and further recommendations.   REHMAN,NAJEEB U  01/19/2011 10:58 AM  CC: Dr. Delorse Lek, MD &

## 2011-01-19 NOTE — H&P (Signed)
[name] is an 65 y.o. male.   Chief Complaint: Patient is in for colonoscopy. HPI: Patient is 65 year old Caucasian male who underwent average risk screening colonoscopy in June 2012. 2 small polyps ablated via cold biopsy from  ascending colon; these were tubular adenomas. There was another sessile polyp in the region of hepatic flexure which could not be removed on account of location and poor prep. Biopsy from this polyp revealed tubular adenoma. He is therefore returning for therapeutic colonoscopy. Him history is negative for colorectal carcinoma.  Past Medical History  Diagnosis Date  . Hypertension   . Diabetes mellitus     Past Surgical History  Procedure Date  . Foot surgery 1982  . Foot surgery 1985  . Appendectomy   . Hernia repair     History reviewed. No pertinent family history. Social History:  reports that he has never smoked. He does not have any smokeless tobacco history on file. He reports that he does not drink alcohol or use illicit drugs.  Allergies: No Known Allergies  Medications Prior to Admission  Medication Dose Route Frequency Provider Last Rate Last Dose  . 0.45 % sodium chloride infusion   Intravenous Once Malissa Hippo, MD 20 mL/hr at 01/19/11 0908    . meperidine (DEMEROL) 50 MG/ML injection           . midazolam (VERSED) 5 MG/5ML injection           . simethicone susp in sterile water 1000 mL irrigation    PRN Malissa Hippo, MD       No current outpatient prescriptions on file as of 01/19/2011.    Results for orders placed during the hospital encounter of 01/19/11 (from the past 48 hour(s))  GLUCOSE, CAPILLARY     Status: Abnormal   Collection Time   01/19/11  8:59 AM      Component Value Range Comment   Glucose-Capillary 172 (*) 70 - 99 (mg/dL)    Comment 1 Notify RN      No results found.  Review of Systems  Constitutional: Negative for weight loss.  Gastrointestinal: Negative for abdominal pain, diarrhea, constipation, blood  in stool and melena.    Blood pressure 170/81, pulse 81, temperature 98.1 F (36.7 C), temperature source Oral, resp. rate 18, SpO2 95.00%. Physical Exam  Constitutional: He appears well-developed and well-nourished.  HENT:  Mouth/Throat: Oropharynx is clear and moist.  Eyes: Conjunctivae are normal. No scleral icterus.  Neck: No thyromegaly present.  Cardiovascular: Normal rate, regular rhythm and normal heart sounds.   No murmur heard. Respiratory: Effort normal and breath sounds normal.  GI: Soft. He exhibits no distension and no mass. There is no tenderness.  Musculoskeletal: He exhibits no edema.  Lymphadenopathy:    He has no cervical adenopathy.  Neurological: He is alert.  Skin: Skin is warm and dry.     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 Colonoscopy with polypectomy.  REHMAN,NAJEEB U 01/19/2011, 9:38 AM

## 2011-01-31 ENCOUNTER — Encounter (HOSPITAL_COMMUNITY): Payer: Self-pay | Admitting: Internal Medicine

## 2011-02-01 ENCOUNTER — Encounter (INDEPENDENT_AMBULATORY_CARE_PROVIDER_SITE_OTHER): Payer: Self-pay | Admitting: *Deleted

## 2011-06-04 ENCOUNTER — Encounter (HOSPITAL_COMMUNITY): Payer: Self-pay | Admitting: *Deleted

## 2011-06-04 ENCOUNTER — Emergency Department (HOSPITAL_COMMUNITY)
Admission: EM | Admit: 2011-06-04 | Discharge: 2011-06-04 | Disposition: A | Discharge status: Home / Self Care | Payer: BC Managed Care – PPO | Attending: Emergency Medicine | Admitting: Emergency Medicine

## 2011-06-04 DIAGNOSIS — I10 Essential (primary) hypertension: Secondary | ICD-10-CM | POA: Insufficient documentation

## 2011-06-04 DIAGNOSIS — K5900 Constipation, unspecified: Secondary | ICD-10-CM | POA: Insufficient documentation

## 2011-06-04 DIAGNOSIS — R339 Retention of urine, unspecified: Secondary | ICD-10-CM | POA: Insufficient documentation

## 2011-06-04 DIAGNOSIS — Z794 Long term (current) use of insulin: Secondary | ICD-10-CM | POA: Insufficient documentation

## 2011-06-04 DIAGNOSIS — E119 Type 2 diabetes mellitus without complications: Secondary | ICD-10-CM | POA: Insufficient documentation

## 2011-06-04 LAB — CBC
Hemoglobin: 15.3 g/dL (ref 13.0–17.0)
MCHC: 33.4 g/dL (ref 30.0–36.0)
RBC: 4.97 MIL/uL (ref 4.22–5.81)

## 2011-06-04 LAB — URINALYSIS, ROUTINE W REFLEX MICROSCOPIC
Hgb urine dipstick: NEGATIVE
Leukocytes, UA: NEGATIVE
Specific Gravity, Urine: 1.02 (ref 1.005–1.030)
Urobilinogen, UA: 0.2 mg/dL (ref 0.0–1.0)

## 2011-06-04 LAB — BASIC METABOLIC PANEL
CO2: 27 mEq/L (ref 19–32)
GFR calc non Af Amer: 52 mL/min — ABNORMAL LOW (ref >90)
Glucose, Bld: 150 mg/dL — ABNORMAL HIGH (ref 70–99)
Potassium: 3.9 mEq/L (ref 3.5–5.1)
Sodium: 137 mEq/L (ref 135–145)

## 2011-06-04 NOTE — ED Notes (Signed)
Pt c/o rectal pain and feels like his rectum is hanging out. Pt also states that he has not voided since 6:30 am.

## 2011-06-04 NOTE — ED Provider Notes (Addendum)
History   This chart was scribed for Shelda Jakes, MD by Sofie Rower. The patient was seen in room APA07/APA07 and the patient's care was started at 4:00 PM     CSN: [id number]  Arrival date & time 06/04/11  1511   First MD Initiated Contact with Patient 06/04/11 1543      Chief Complaint  Patient presents with  . Urinary Retention    (Consider location/radiation/quality/duration/timing/severity/associated sxs/prior treatment) HPI  [name] is a 66 y.o. male who presents to the Emergency Department complaining of severe, episodic urinary retention onset today with associated symptoms of rectal pain, constipation, abdominal pain. The pt states "he was able to urinate yesterday and today at 6:30AM with no difficulty." "He tried to go to the bathroom before he came to [facility] and it felt like a balloon was hanging out of his back side, I tried to pee but I can't." Pt also complains of severe, episodic rectal pain onset today. Pt states "at 11:00AM while at work, he used the restroom, wiped his backside and there was blood." Pt refers to the rectal pain as a "pressure pain." Pt has a hx of diabetes for which he takes insulin, sinus infection, for which he had a visit with Dr. Rosezetta Schlatter at [place], pt was put on antibiotics (doxycyclin), hypertension for which the patient takes lisinopril, appendicitis, two hernia operations, colonoscopy (2 performed in 2012, Dr. Karilyn Cota on 01/19/11). Pt denies difficulty voiding in the past, hematuria, blood in the toilet after using the restroom, hx of hemmeroids, nausea, vomiting, fever, chills, headache, neck or back pain, diarrhea, cough, chest pain, congestion, sore throat, rash, bleeding easily.   PCP is Dr. Doristine Counter.  Past Medical History  Diagnosis Date  . Hypertension   . Diabetes mellitus     Past Surgical History  Procedure Date  . Foot surgery 1982  . Foot surgery 1985  . Appendectomy   . Hernia repair   . Colonoscopy 01/19/2011   Procedure: COLONOSCOPY;  Surgeon: Malissa Hippo, MD;  [facility];  Service: Endoscopy;  Laterality: N/A;  9:30     History  Substance Use Topics  . Smoking status: Never Smoker   . Smokeless tobacco: Not on file  . Alcohol Use: No      Review of Systems  All other systems reviewed and are negative.    10 Systems reviewed and all are negative for acute change except as noted in the HPI.    Allergies  Review of patient's allergies indicates no known allergies.  Home Medications   Current Outpatient Rx  Name Route Sig Dispense Refill  . CYCLOBENZAPRINE HCL 10 MG PO TABS Oral Take 10 mg by mouth 3 (three) times daily.    Marland Kitchen DOXYCYCLINE HYCLATE 100 MG PO CAPS Oral Take 100 mg by mouth 2 (two) times daily. Patient started taking on 05/19/11 for 20 days    . GLIPIZIDE 10 MG PO TABS Oral Take 10 mg by mouth daily.     Marland Kitchen HYDROCODONE-ACETAMINOPHEN 10-325 MG PO TABS Oral Take 1 tablet by mouth 4 (four) times daily as needed. pain    . INSULIN GLARGINE 100 UNIT/ML [place] SOLN Subcutaneous Inject 70 Units into the skin 2 (two) times daily.      Marland Kitchen LISINOPRIL 10 MG PO TABS Oral Take 10 mg by mouth daily.      BP 102/67  Pulse 121  Temp(Src) 97.6 F (36.4 C) (Oral)  Resp 24  Ht 5\' 5"  (1.651  m)  Wt 223 lb (101.152 kg)  BMI 37.11 kg/m2  SpO2 98%  Physical Exam  Nursing note and vitals reviewed. Constitutional: He is oriented to person, place, and time. He appears well-developed and well-nourished.  HENT:  Right Ear: External ear normal.  Left Ear: External ear normal.  Nose: Nose normal.  Mouth/Throat: Oropharynx is clear and moist.  Eyes: EOM are normal. Pupils are equal, round, and reactive to light.  Neck: Normal range of motion.  Cardiovascular: Normal rate, regular rhythm and normal heart sounds.  Exam reveals no gallop and no friction rub.   No murmur heard. Pulmonary/Chest: Effort normal and breath sounds normal. He has no wheezes. He has no rales.    Abdominal: Soft. Bowel sounds are normal. There is no tenderness. There is no rebound.  Genitourinary:       No protruding hernia found on examination, no anterior/posterior fizzure noted, hard stool found upon internal anorectal examination, no hemorid's noted. Negative for gross blood.   Musculoskeletal: Normal range of motion.  Lymphadenopathy:    He has no cervical adenopathy.  Neurological: He is alert and oriented to person, place, and time. No cranial nerve deficit.  Skin: Skin is warm and dry.  Psychiatric: He has a normal mood and affect. His behavior is normal.    ED Course  Procedures (including critical care time)  DIAGNOSTIC STUDIES: Oxygen Saturation is 98% on room air, normal by my interpretation.    COORDINATION OF CARE:    Results for orders placed during the hospital encounter of 06/04/11  CBC      Component Value Range   WBC 11.6 (*) 4.0 - 10.5 (K/uL)   RBC 4.97  4.22 - 5.81 (MIL/uL)   Hemoglobin 15.3  13.0 - 17.0 (g/dL)   HCT 82.9  56.2 - 13.0 (%)   MCV 92.2  78.0 - 100.0 (fL)   MCH 30.8  26.0 - 34.0 (pg)   MCHC 33.4  30.0 - 36.0 (g/dL)   RDW 86.5  78.4 - 69.6 (%)   Platelets 254  150 - 400 (K/uL)  BASIC METABOLIC PANEL      Component Value Range   Sodium 137  135 - 145 (mEq/L)   Potassium 3.9  3.5 - 5.1 (mEq/L)   Chloride 99  96 - 112 (mEq/L)   CO2 27  19 - 32 (mEq/L)   Glucose, Bld 150 (*) 70 - 99 (mg/dL)   BUN 22  6 - 23 (mg/dL)   Creatinine, Ser 2.95 (*) 0.50 - 1.35 (mg/dL)   Calcium 28.4  8.4 - 10.5 (mg/dL)   GFR calc non Af Amer 52 (*) >90 (mL/min)   GFR calc Af Amer 60 (*) >90 (mL/min)  URINALYSIS, ROUTINE W REFLEX MICROSCOPIC      Component Value Range   Color, Urine YELLOW  YELLOW    APPearance CLEAR  CLEAR    Specific Gravity, Urine 1.020  1.005 - 1.030    pH 6.0  5.0 - 8.0    Glucose, UA 100 (*) NEGATIVE (mg/dL)   Hgb urine dipstick NEGATIVE  NEGATIVE    Bilirubin Urine NEGATIVE  NEGATIVE    Ketones, ur NEGATIVE  NEGATIVE (mg/dL)    Protein, ur NEGATIVE  NEGATIVE (mg/dL)   Urobilinogen, UA 0.2  0.0 - 1.0 (mg/dL)   Nitrite NEGATIVE  NEGATIVE    Leukocytes, UA NEGATIVE  NEGATIVE    No results found.    1. Urinary retention   2. Constipation     4:13PM-  EDP at bedside discusses treatment plan.   5:50PM- Recheck. EDP continues treatment plan. Pt was able to void urine and expel feces when using the restroom.   MDM  Obvious urinary retention patient with Foley was placed a greater than 600 cc of urine out. After decompression of the bladder patient was able to have a bowel movement. On rectal exam I was able to disimpact him and broke up a hard stool. He was  then go on to have a bowel movement here patient feels much better. Etiology urinary retention is not clear where part urology followup will be converted over to a leg bag and followup with primary care doctor as well as urology. Constipation could be related to the recent antibiotic or medication changes. Urinalysis negative for urinary tract infection renal function is normal with exception of slight elevation in the creatinine of 1.37.  As stated above would've expected the CT did show some sort of intercranial changes after this number of days if this was stroke related. However possible need for MRI if symptoms persist discussed with family.    I personally performed the services described in this documentation, which was scribed in my presence. The recorded information has been reviewed and considered.    Shelda Jakes, MD 06/04/11 1757  Shelda Jakes, MD 06/05/11 [phone]

## 2011-06-04 NOTE — Discharge Instructions (Signed)
Keep leg bag in place at least for 2 days training with full. Give urology a call also follow up with your primary care Dr. Silvestre Gunner on Monday or Tuesday perhaps can remove the Foley catheter at that time. Return for any new or worse symptoms. Follow instructions provided for constipation in adults.  Constipation in Adults Constipation is having fewer than 2 bowel movements per week. Usually, the stools are hard. As we grow older, constipation is more common. If you try to fix constipation with laxatives, the problem may get worse. This is because laxatives taken over a long period of time make the colon muscles weaker. A low-fiber diet, not taking in enough fluids, and taking some medicines may make these problems worse. MEDICATIONS THAT MAY CAUSE CONSTIPATION  Water pills (diuretics).   Calcium channel blockers (used to control blood pressure and for the heart).   Certain pain medicines (narcotics).   Anticholinergics.   Anti-inflammatory agents.   Antacids that contain aluminum.  DISEASES THAT CONTRIBUTE TO CONSTIPATION  Diabetes.   Parkinson's disease.   Dementia.   Stroke.   Depression.   Illnesses that cause problems with salt and water metabolism.  HOME CARE INSTRUCTIONS   Constipation is usually best cared for without medicines. Increasing dietary fiber and eating more fruits and vegetables is the best way to manage constipation.   Slowly increase fiber intake to 25 to 38 grams per day. Whole grains, fruits, vegetables, and legumes are good sources of fiber. A dietitian can further help you incorporate high-fiber foods into your diet.   Drink enough water and fluids to keep your urine clear or pale yellow.   A fiber supplement may be added to your diet if you cannot get enough fiber from foods.   Increasing your activities also helps improve regularity.   Suppositories, as suggested by your caregiver, will also help. If you are using antacids, such as aluminum or  calcium containing products, it will be helpful to switch to products containing magnesium if your caregiver says it is okay.   If you have been given a liquid injection (enema) today, this is only a temporary measure. It should not be relied on for treatment of longstanding (chronic) constipation.   Stronger measures, such as magnesium sulfate, should be avoided if possible. This may cause uncontrollable diarrhea. Using magnesium sulfate may not allow you time to make it to the bathroom.  SEEK IMMEDIATE MEDICAL CARE IF:   There is bright red blood in the stool.   The constipation stays for more than 4 days.   There is belly (abdominal) or rectal pain.   You do not seem to be getting better.   You have any questions or concerns.  MAKE SURE YOU:   Understand these instructions.   Will watch your condition.   Will get help right away if you are not doing well or get worse.  Document Released: 11/06/2003 Document Revised: 01/27/2011 Document Reviewed: 01/11/2011 [facility] Patient Information 2012 [place], [place].

## 2011-06-06 ENCOUNTER — Encounter (HOSPITAL_COMMUNITY): Payer: Self-pay | Admitting: Oncology

## 2011-06-06 ENCOUNTER — Emergency Department (HOSPITAL_COMMUNITY)
Admission: EM | Admit: 2011-06-06 | Discharge: 2011-06-06 | Disposition: A | Discharge status: Home / Self Care | Payer: BC Managed Care – PPO | Attending: Emergency Medicine | Admitting: Emergency Medicine

## 2011-06-06 DIAGNOSIS — I10 Essential (primary) hypertension: Secondary | ICD-10-CM | POA: Insufficient documentation

## 2011-06-06 DIAGNOSIS — T839XXA Unspecified complication of genitourinary prosthetic device, implant and graft, initial encounter: Secondary | ICD-10-CM

## 2011-06-06 DIAGNOSIS — E119 Type 2 diabetes mellitus without complications: Secondary | ICD-10-CM | POA: Insufficient documentation

## 2011-06-06 DIAGNOSIS — Z466 Encounter for fitting and adjustment of urinary device: Secondary | ICD-10-CM | POA: Insufficient documentation

## 2011-06-06 NOTE — Discharge Instructions (Signed)
Follow up with urologist for continued problems or return if worse

## 2011-06-06 NOTE — ED Provider Notes (Signed)
History   This chart was scribed for Donnetta Hutching, MD by Brooks Sailors. The patient was seen in room APFT22/APFT22. Patient's care was started at 0926.   CSN: [id number]  Arrival date & time 06/06/11  7829   First MD Initiated Contact with Patient 06/06/11 1017      Chief Complaint  Patient presents with  . Catheter removal     (Consider location/radiation/quality/duration/timing/severity/associated sxs/prior treatment) HPI  [name] is a 66 y.o. male who presents to the Emergency Department complaining of for a catheter removal. Patient had catetheter put in two days ago at [facility] b/c he was unable to urinate and had constipation. Constipation was resolved and was told to come in two days later to have it removed. Patient says urination is usually frequent and has not had incontinence like this ever before.    Past Medical History  Diagnosis Date  . Hypertension   . Diabetes mellitus     Past Surgical History  Procedure Date  . Foot surgery 1982  . Foot surgery 1985  . Appendectomy   . Hernia repair   . Colonoscopy 01/19/2011    Procedure: COLONOSCOPY;  Surgeon: Malissa Hippo, MD;  [facility];  Service: Endoscopy;  Laterality: N/A;  9:30    No family history on file.  History  Substance Use Topics  . Smoking status: Never Smoker   . Smokeless tobacco: Not on file  . Alcohol Use: No      Review of Systems  All other systems reviewed and are negative.    Allergies  Review of patient's allergies indicates no known allergies.  Home Medications   Current Outpatient Rx  Name Route Sig Dispense Refill  . CYCLOBENZAPRINE HCL 10 MG PO TABS Oral Take 10 mg by mouth 3 (three) times daily as needed. Muscle Spasms    . DOXYCYCLINE HYCLATE 100 MG PO CAPS Oral Take 100 mg by mouth 2 (two) times daily. Patient started taking on 05/19/11 for 20 days    . GLIPIZIDE 10 MG PO TABS Oral Take 10 mg by mouth daily.     Marland Kitchen HYDROCODONE-ACETAMINOPHEN 10-325  MG PO TABS Oral Take 1 tablet by mouth 4 (four) times daily as needed. pain    . INSULIN GLARGINE 100 UNIT/ML [place] SOLN Subcutaneous Inject 70 Units into the skin 2 (two) times daily.      Marland Kitchen LISINOPRIL 10 MG PO TABS Oral Take 10 mg by mouth daily.      BP 136/82  Pulse 86  Temp(Src) 97.7 F (36.5 C) (Oral)  Resp 18  Ht 5\' 5"  (1.651 m)  Wt 228 lb (103.42 kg)  BMI 37.94 kg/m2  SpO2 98%  Physical Exam  Nursing note and vitals reviewed. Constitutional: He is oriented to person, place, and time. He appears well-developed and well-nourished.  HENT:  Head: Normocephalic and atraumatic.  Eyes: Conjunctivae and EOM are normal. Pupils are equal, round, and reactive to light.  Neck: Normal range of motion. Neck supple.  Cardiovascular: Normal rate and regular rhythm.   Pulmonary/Chest: Effort normal and breath sounds normal.  Abdominal: Soft. Bowel sounds are normal.  Musculoskeletal: Normal range of motion.  Neurological: He is alert and oriented to person, place, and time.  Skin: Skin is warm and dry.  Psychiatric: He has a normal mood and affect.    ED Course  Procedures (including critical care time) DIAGNOSTIC STUDIES: Oxygen Saturation is 98% on room air, normal by my interpretation.  COORDINATION OF CARE: 11:00AM - Patient will have catheter removed and to be discharged. Discussed with patient reasons for incontinence may have been constipation.    Labs Reviewed - No data to display No results found.   No diagnosis found.    MDM  Foley catheter removed. Patient will return if worse or follow up with urology       I personally performed the services described in this documentation, which was scribed in my presence. The recorded information has been reviewed and considered.    Donnetta Hutching, MD 06/06/11 1159

## 2011-06-06 NOTE — ED Notes (Signed)
Pt states is here for foley catheter removal per instructions. Placed in Ed on Friday night secondary to no void and constipation per pt. Pt tolerated removal well.

## 2011-06-06 NOTE — ED Notes (Signed)
[name] states he had a catheter placed this past Saturday and was told to have it taken out today.  [name] states he is going to see Dr. Vernie Ammons in 2 days, but he wants the cath out today.  [name] denies any problems with the cath other than discomfort/inconvience of having it in place.

## 2011-07-14 ENCOUNTER — Other Ambulatory Visit: Payer: Self-pay | Admitting: Internal Medicine

## 2012-01-23 ENCOUNTER — Encounter (INDEPENDENT_AMBULATORY_CARE_PROVIDER_SITE_OTHER): Payer: Self-pay | Admitting: *Deleted

## 2012-03-07 ENCOUNTER — Other Ambulatory Visit (INDEPENDENT_AMBULATORY_CARE_PROVIDER_SITE_OTHER): Payer: Self-pay | Admitting: *Deleted

## 2012-03-07 ENCOUNTER — Telehealth (INDEPENDENT_AMBULATORY_CARE_PROVIDER_SITE_OTHER): Payer: Self-pay | Admitting: *Deleted

## 2012-03-07 DIAGNOSIS — Z86010 Personal history of colonic polyps: Secondary | ICD-10-CM

## 2012-03-07 DIAGNOSIS — Z1211 Encounter for screening for malignant neoplasm of colon: Secondary | ICD-10-CM

## 2012-03-07 MED ORDER — PEG-KCL-NACL-NASULF-NA ASC-C 100 G PO SOLR: 1.0000 | Freq: Once | ORAL | Status: DC

## 2012-03-07 NOTE — Telephone Encounter (Signed)
Patient needs movi prep 

## 2012-03-14 ENCOUNTER — Telehealth (INDEPENDENT_AMBULATORY_CARE_PROVIDER_SITE_OTHER): Payer: Self-pay | Admitting: *Deleted

## 2012-03-14 NOTE — Telephone Encounter (Signed)
  Procedure: tcs  Reason/Indication:  Hx polyps  Has patient had this procedure before?  Yes, 12/2010  If so, when, by whom and where?    Is there a family history of colon cancer?  no  Who?  What age when diagnosed?    Is patient diabetic?   yes      Does patient have prosthetic heart valve?  no  Do you have a pacemaker?  no  Has patient had joint replacement within last 12 months?  no  Is patient on Coumadin, Plavix and/or Aspirin? yes  Medications: asa 325 mg daily, lantus 100 units injection each morning, glipizide 4 mg 1/2 tab in am & 1/2 tab in pm, lisinopril 10 mg daily  Allergies: nkda  Medication Adjustment: asa 2 days, hold evening dose of glipizide evening before, don't take lantus or glipizide morning of procedure  Procedure date & time: 04/12/12 at 1200

## 2012-03-15 NOTE — Telephone Encounter (Signed)
agree

## 2012-04-02 ENCOUNTER — Encounter (HOSPITAL_COMMUNITY): Payer: Self-pay | Admitting: Pharmacy Technician

## 2012-04-12 ENCOUNTER — Encounter (HOSPITAL_COMMUNITY)
Admission: RE | Disposition: A | Discharge status: Home / Self Care | Payer: Self-pay | Source: Ambulatory Visit | Attending: Internal Medicine

## 2012-04-12 ENCOUNTER — Ambulatory Visit (HOSPITAL_COMMUNITY)
Admission: RE | Admit: 2012-04-12 | Discharge: 2012-04-12 | Disposition: A | Discharge status: Home / Self Care | Payer: Medicare Other | Source: Ambulatory Visit | Attending: Internal Medicine | Admitting: Internal Medicine

## 2012-04-12 ENCOUNTER — Encounter (HOSPITAL_COMMUNITY): Payer: Self-pay

## 2012-04-12 DIAGNOSIS — Z860100 Personal history of colon polyps, unspecified: Secondary | ICD-10-CM | POA: Insufficient documentation

## 2012-04-12 DIAGNOSIS — K5520 Angiodysplasia of colon without hemorrhage: Secondary | ICD-10-CM | POA: Insufficient documentation

## 2012-04-12 DIAGNOSIS — D126 Benign neoplasm of colon, unspecified: Secondary | ICD-10-CM | POA: Insufficient documentation

## 2012-04-12 DIAGNOSIS — K644 Residual hemorrhoidal skin tags: Secondary | ICD-10-CM

## 2012-04-12 DIAGNOSIS — K6389 Other specified diseases of intestine: Secondary | ICD-10-CM

## 2012-04-12 DIAGNOSIS — Z01812 Encounter for preprocedural laboratory examination: Secondary | ICD-10-CM | POA: Insufficient documentation

## 2012-04-12 DIAGNOSIS — K5730 Diverticulosis of large intestine without perforation or abscess without bleeding: Secondary | ICD-10-CM

## 2012-04-12 DIAGNOSIS — I10 Essential (primary) hypertension: Secondary | ICD-10-CM | POA: Insufficient documentation

## 2012-04-12 DIAGNOSIS — K648 Other hemorrhoids: Secondary | ICD-10-CM | POA: Insufficient documentation

## 2012-04-12 DIAGNOSIS — E119 Type 2 diabetes mellitus without complications: Secondary | ICD-10-CM | POA: Insufficient documentation

## 2012-04-12 HISTORY — PX: COLONOSCOPY: SHX5424

## 2012-04-12 SURGERY — COLONOSCOPY
Anesthesia: Moderate Sedation

## 2012-04-12 MED ORDER — STERILE WATER FOR IRRIGATION IR SOLN
Status: DC | PRN
  Administered 2012-04-12: 12:00:00

## 2012-04-12 MED ORDER — MIDAZOLAM HCL 5 MG/5ML IJ SOLN
INTRAMUSCULAR | Status: AC
  Filled 2012-04-12: qty 10

## 2012-04-12 MED ORDER — MIDAZOLAM HCL 5 MG/5ML IJ SOLN
INTRAMUSCULAR | Status: DC | PRN
  Administered 2012-04-12 (×3): 2 mg via INTRAVENOUS

## 2012-04-12 MED ORDER — MEPERIDINE HCL 50 MG/ML IJ SOLN
INTRAMUSCULAR | Status: AC
  Filled 2012-04-12: qty 1

## 2012-04-12 MED ORDER — DOCUSATE SODIUM 100 MG PO CAPS: 100.0000 mg | ORAL_CAPSULE | Freq: Every day | ORAL | Status: DC

## 2012-04-12 MED ORDER — MEPERIDINE HCL 50 MG/ML IJ SOLN
INTRAMUSCULAR | Status: DC | PRN
  Administered 2012-04-12 (×2): 25 mg via INTRAVENOUS

## 2012-04-12 MED ORDER — SODIUM CHLORIDE 0.45 % IV SOLN
INTRAVENOUS | Status: DC
  Administered 2012-04-12: 11:00:00 via INTRAVENOUS

## 2012-04-12 NOTE — H&P (Signed)
[name] is an 67 y.o. male.   Chief Complaint: Patient is here for colonoscopy. HPI: Patient is 67 year old Caucasian male who had complex polyp removed piecemeal from hepatic flexure back in November 2012. Residual polyp was treated with APC. He had another adenoma removed. He is returning for surveillance colonoscopy. He has intermittent constipation but denies abdominal pain or rectal bleeding. Family history is negative for colorectal carcinoma.  Past Medical History  Diagnosis Date  . Hypertension   . Diabetes mellitus     Past Surgical History  Procedure Laterality Date  . Foot surgery  1982  . Foot surgery  1985  . Appendectomy    . Hernia repair    . Colonoscopy  01/19/2011    Procedure: COLONOSCOPY;  Surgeon: Malissa Hippo, MD;  [facility];  Service: Endoscopy;  Laterality: N/A;  9:30    History reviewed. No pertinent family history. Social History:  reports that he has never smoked. He does not have any smokeless tobacco history on file. He reports that he does not drink alcohol or use illicit drugs.  Allergies: No Known Allergies  Medications Prior to Admission  Medication Sig Dispense Refill  . cyclobenzaprine (FLEXERIL) 10 MG tablet Take 10 mg by mouth 3 (three) times daily as needed. Muscle Spasms      . glimepiride (AMARYL) 4 MG tablet Take 4 mg by mouth daily before breakfast.      . HYDROcodone-acetaminophen (NORCO) 10-325 MG per tablet Take 1 tablet by mouth 4 (four) times daily as needed. pain      . insulin glargine (LANTUS) 100 UNIT/ML injection Inject 100 Units into the skin daily.       Marland Kitchen lisinopril (PRINIVIL,ZESTRIL) 10 MG tablet Take 10 mg by mouth daily.      . peg 3350 powder (MOVIPREP) 100 G SOLR Take 1 kit (100 g total) by mouth once.  1 kit  0    No results found for this or any previous visit (from the past 48 hour(s)). No results found.  ROS  Blood pressure 123/71, temperature 97.8 F (36.6 C), temperature source Oral,  resp. rate 22, height 5\' 5"  (1.651 m), weight 220 lb (99.791 kg), SpO2 98.00%. Physical Exam  Constitutional: He appears well-developed and well-nourished.  HENT:  Mouth/Throat: Oropharynx is clear and moist.  Eyes: Conjunctivae are normal. No scleral icterus.  Neck: No thyromegaly present.  Cardiovascular: Normal rate, regular rhythm and normal heart sounds.   No murmur heard. Respiratory: Effort normal.  GI: He exhibits no distension and no mass. There is no tenderness.  Musculoskeletal: He exhibits no edema.  Lymphadenopathy:    He has no cervical adenopathy.  Neurological: He is alert.  Skin: Skin is warm and dry.     Assessment/Plan History of colonic adenomas. Surveillance colonoscopy.  REHMAN,NAJEEB U 04/12/2012, 12:12 PM

## 2012-04-12 NOTE — Op Note (Signed)
COLONOSCOPY PROCEDURE REPORT  PATIENT:  [name]  MR#:  [id number] Birthdate:  Sep 13, 1945, 67 y.o., male Endoscopist:  Dr. Malissa Hippo, MD Referred By:  Dr. Delorse Lek, MD Procedure Date: 04/12/2012  Procedure:   Colonoscopy  Indications:  Patient is 73 usual Caucasian male with complex polyp removed from hepatic flexure over a year ago with combination of piecemeal polypectomy and APC.  Informed Consent:  The procedure and risks were reviewed with the patient and informed consent was obtained.  Medications:  Demerol 50 mg IV Versed 6 mg IV  Description of procedure:  After a digital rectal exam was performed, that colonoscope was advanced from the anus through the rectum and colon to the area of the cecum, ileocecal valve and appendiceal orifice. The cecum was deeply intubated. These structures were well-seen and photographed for the record. From the level of the cecum and ileocecal valve, the scope was slowly and cautiously withdrawn. The mucosal surfaces were carefully surveyed utilizing scope tip to flexion to facilitate fold flattening as needed. The scope was pulled down into the rectum where a thorough exam including retroflexion was performed.  Findings:   Prep satisfactory.  Two small diverticula at cecum.  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  Single erosion ulcer at cecum. Two small polyps ablated via cold biopsy from ascending colon and submitted together.  Normal rectal mucosa.  Small hemorrhoids below the dentate line and anal papillae.    Therapeutic/Diagnostic Maneuvers Performed:  See above  Complications:  None  Cecal Withdrawal Time:  18 minutes  Impression:  Examination performed to cecum. No evidence of residual polyp at hepatic flexure. 2 small cecal diverticulum. Single small cecal AV malformation. Single erosion at cecum possibly secondary to NSAID use. Small external hemorrhoids and tiny anal papillae.  Recommendations:  Standard  instructions given. I will contact patient with biopsy results and further recommendations. Fiber supplement 3-4 g by mouth daily. Colace 200 mg by mouth each bedtime.   REHMAN,NAJEEB U  04/12/2012 12:55 PM  CC: Dr. Delorse Lek, MD & Dr. Bonnetta Barry ref. provider found

## 2012-04-16 ENCOUNTER — Encounter (HOSPITAL_COMMUNITY): Payer: Self-pay | Admitting: Internal Medicine

## 2012-05-01 ENCOUNTER — Encounter: Payer: Self-pay | Admitting: Cardiology

## 2012-05-01 ENCOUNTER — Ambulatory Visit (INDEPENDENT_AMBULATORY_CARE_PROVIDER_SITE_OTHER): Payer: Medicare Other | Admitting: Cardiology

## 2012-05-01 VITALS — BP 146/80 | HR 103 | Ht 65.0 in | Wt 227.0 lb

## 2012-05-01 DIAGNOSIS — Z79899 Other long term (current) drug therapy: Secondary | ICD-10-CM

## 2012-05-01 DIAGNOSIS — I10 Essential (primary) hypertension: Secondary | ICD-10-CM

## 2012-05-01 DIAGNOSIS — E785 Hyperlipidemia, unspecified: Secondary | ICD-10-CM

## 2012-05-01 MED ORDER — OLMESARTAN MEDOXOMIL 40 MG PO TABS: 40.0000 mg | ORAL_TABLET | Freq: Every day | ORAL | Status: DC

## 2012-05-01 NOTE — Patient Instructions (Signed)
   Increase Benicar to 40mg  daily - new sent to pharm   Lab in 2 weeks for BMET  Office will contact with results  Contact Dr. Doristine Counter regarding Crestor dose  Continue all other current medications. Follow up in  1 - 2 years

## 2012-05-01 NOTE — Progress Notes (Signed)
Patient ID: [name], male   DOB: 1945/08/28, 67 y.o.   MRN: [id number] PCP: Dr. Doristine Counter  67 yo with history of HTN, Type II diabetes, and hyperlipidemia presents for cardiology evaluation prior to left TKR.  Patient had a left heart cath in 2008 with mild nonobstructive CAD (atypical chest pain).  He has severe OA of the left knee and needs an operation now. Besides his knee pain, he has no significant limitations.  No chest pain.  He is able to climb a flight of steps without dyspnea.  He is only limited by knee pain when he walks.  He is not currently on a statin but says that he plans to talk to his PCP about restarting it (has switched insurances).  His BP continues to run high at times.  He started on olmesartan recently.  It is mildly elevated today.   ECG: NSR, rightward axis  Labs (4/13): K 3.9, creatinine 1.37  PMH: 1. Colon polyps 2. 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 3. Palpitations: Monitor in 2009 without significant arrhythmias 4. HTN: Cough with ACEI 5. Type II diabetes 6. Hyperlipidemia 7. OA  SH: Retired in 1/14 from [employer]. Lives in [place].  Prior smoker.   FH: CAD  ROS: All systems reviewed and negative except as per HPI.   Current Outpatient Prescriptions  Medication Sig Dispense Refill  . aspirin 325 MG tablet Take 325 mg by mouth daily.      Marland Kitchen glimepiride (AMARYL) 4 MG tablet Take 2 mg by mouth 2 (two) times daily.       . insulin glargine (LANTUS) 100 UNIT/ML injection Inject 100 Units into the skin daily.       . Multiple Vitamins-Minerals (CENTRUM SILVER ADULT 50+ PO) Take 1 tablet by mouth daily.      Marland Kitchen olmesartan (BENICAR) 40 MG tablet Take 1 tablet (40 mg total) by mouth daily.  30 tablet  6  . psyllium (METAMUCIL) 58.6 % packet Take 1 packet by mouth daily.       No current facility-administered medications for this visit.   BP 146/80  Pulse 103  Ht 5\' 5"  (1.651 m)  Wt 227 lb (102.967 kg)  BMI 37.77 kg/m2 General: NAD,  obese Neck: No JVD, no thyromegaly or thyroid nodule.  Lungs: Clear to auscultation bilaterally with normal respiratory effort. CV: Nondisplaced PMI.  Heart regular S1/S2, no S3/S4, no murmur.  No peripheral edema.  No carotid bruit.  Normal pedal pulses.  Abdomen: Soft, nontender, no hepatosplenomegaly, no distention.  Skin: Intact without lesions or rashes.  Neurologic: Alert and oriented x 3.  Psych: Normal affect. Extremities: No clubbing or cyanosis.  HEENT: Normal.   Assessment/Plan: 1. Preoperative evaluation: Patient denies significant exertional dyspnea or chest pain.  No further testing is indicated prior to surgery.  2. HTN: BP continues to run high.  I will have him increase olmesartan to 40 mg daily with BMET in 2 wks.  3. Hyperlipidemia: CAD risk is enough that he ought to be on a statin.  Apparently he went off Crestor when he changed insurance but plans to restart.  He will contact his PCP to do this.  Marca Ancona 05/02/2012 5:37 AM

## 2012-05-17 ENCOUNTER — Telehealth: Payer: Self-pay | Admitting: *Deleted

## 2012-05-17 NOTE — Telephone Encounter (Signed)
Patient request that we send note for cardiac clearance for knee surgery.  OV note from Dr. Shirlee Latch faxed to Dr. Jillyn Hidden - [facility].

## 2012-05-21 ENCOUNTER — Other Ambulatory Visit: Payer: Self-pay | Admitting: Orthopedic Surgery

## 2012-05-30 ENCOUNTER — Encounter (HOSPITAL_COMMUNITY): Payer: Self-pay | Admitting: Pharmacy Technician

## 2012-06-04 ENCOUNTER — Encounter (HOSPITAL_COMMUNITY)
Admission: RE | Admit: 2012-06-04 | Discharge: 2012-06-04 | Disposition: A | Discharge status: Home / Self Care | Payer: Medicare Other | Source: Ambulatory Visit | Attending: Specialist | Admitting: Specialist

## 2012-06-04 ENCOUNTER — Ambulatory Visit (HOSPITAL_COMMUNITY)
Admission: RE | Admit: 2012-06-04 | Discharge: 2012-06-04 | Disposition: A | Discharge status: Home / Self Care | Payer: Medicare Other | Source: Ambulatory Visit | Attending: Specialist | Admitting: Specialist

## 2012-06-04 ENCOUNTER — Ambulatory Visit (HOSPITAL_COMMUNITY)
Admission: RE | Admit: 2012-06-04 | Discharge: 2012-06-04 | Disposition: A | Discharge status: Home / Self Care | Payer: Medicare Other | Source: Ambulatory Visit | Attending: Orthopedic Surgery | Admitting: Orthopedic Surgery

## 2012-06-04 ENCOUNTER — Encounter (HOSPITAL_COMMUNITY): Payer: Self-pay

## 2012-06-04 DIAGNOSIS — Z01812 Encounter for preprocedural laboratory examination: Secondary | ICD-10-CM | POA: Insufficient documentation

## 2012-06-04 DIAGNOSIS — M1710 Unilateral primary osteoarthritis, unspecified knee: Secondary | ICD-10-CM | POA: Insufficient documentation

## 2012-06-04 DIAGNOSIS — E119 Type 2 diabetes mellitus without complications: Secondary | ICD-10-CM | POA: Insufficient documentation

## 2012-06-04 HISTORY — DX: Angina pectoris, unspecified: I20.9

## 2012-06-04 HISTORY — DX: Unspecified osteoarthritis, unspecified site: M19.90

## 2012-06-04 HISTORY — DX: Gastro-esophageal reflux disease without esophagitis: K21.9

## 2012-06-04 LAB — SURGICAL PCR SCREEN: Staphylococcus aureus: NEGATIVE

## 2012-06-04 LAB — PROTIME-INR: INR: 0.92 (ref 0.00–1.49)

## 2012-06-04 LAB — URINALYSIS, ROUTINE W REFLEX MICROSCOPIC
Bilirubin Urine: NEGATIVE
Glucose, UA: >1000 mg/dL — AB
Hgb urine dipstick: NEGATIVE
Ketones, ur: NEGATIVE mg/dL
Protein, ur: NEGATIVE mg/dL
Urobilinogen, UA: 1 mg/dL (ref 0.0–1.0)

## 2012-06-04 LAB — BASIC METABOLIC PANEL
CO2: 28 mEq/L (ref 19–32)
Calcium: 9.3 mg/dL (ref 8.4–10.5)
Chloride: 103 mEq/L (ref 96–112)
Creatinine, Ser: 1.31 mg/dL (ref 0.50–1.35)
GFR calc Af Amer: 63 mL/min — ABNORMAL LOW (ref >90)
Sodium: 139 mEq/L (ref 135–145)

## 2012-06-04 LAB — CBC
HCT: 42.3 % (ref 39.0–52.0)
Platelets: 201 10*3/uL (ref 150–400)
RBC: 4.59 MIL/uL (ref 4.22–5.81)
RDW: 12.8 % (ref 11.5–15.5)
WBC: 5.8 10*3/uL (ref 4.0–10.5)

## 2012-06-04 LAB — APTT: aPTT: 28 seconds (ref 24–37)

## 2012-06-04 LAB — ABO/RH: ABO/RH(D): O POS

## 2012-06-04 NOTE — Progress Notes (Signed)
EKG 05/01/12 in [facility]

## 2012-06-04 NOTE — Patient Instructions (Addendum)
20 HARTMAN MINAHAN  06/04/2012   Your procedure is scheduled on: 06/14/12  Report to [facility] at 0800 AM.  Call this number if you have problems the morning of surgery 336-: [phone]   Remember:   Do not eat food or drink liquids After Midnight.   Do not wear jewelry, make-up or nail polish.  Do not wear lotions, powders, or perfumes. You may wear deodorant.  Do not shave 48 hours prior to surgery. Men may shave face and neck.  Do not bring valuables to the hospital.  Contacts, dentures or bridgework may not be worn into surgery.  Leave suitcase in the car. After surgery it may be brought to your room.  For patients admitted to the hospital, checkout time is 11:00 AM the day of discharge.    Please read over the following fact sheets that you were given: MRSA Information, blood fact sheet, incentive spirometry fact sheet Birdie Sons, RN  pre op nurse call if needed [phone]    FAILURE TO FOLLOW THESE INSTRUCTIONS MAY RESULT IN CANCELLATION OF YOUR SURGERY   Patient Signature: ___________________________________________

## 2012-06-04 NOTE — Progress Notes (Signed)
06/04/12 1142  OBSTRUCTIVE SLEEP APNEA  Have you ever been diagnosed with sleep apnea through a sleep study? No  Do you snore loudly (loud enough to be heard through closed doors)?  0  Do you often feel tired, fatigued, or sleepy during the daytime? 0  Has anyone observed you stop breathing during your sleep? 0  Do you have, or are you being treated for high blood pressure? 1  BMI more than 35 kg/m2? 1  Age over 68 years old? 1  Neck circumference greater than 40 cm/18 inches? 0  Gender: 1  Obstructive Sleep Apnea Score 4  Score 4 or greater  Results sent to PCP

## 2012-06-06 ENCOUNTER — Other Ambulatory Visit: Payer: Self-pay | Admitting: Orthopedic Surgery

## 2012-06-06 NOTE — H&P (Signed)
[name] DOB: [birth year] H&P date 06/06/12  Chief Complaint: left knee pain.  History of Present Illness The patient is a 67 year old male who comes in today for a preoperative History and Physical. The patient is scheduled for a left total knee arthroplasty to be performed by Dr. Javier Docker, MD at [facility] on June 14, 2012. Patient c/o severe left knee pain without injury. Notes pain, swelling, stiffness, popping, giving way and instability. The patient feels that they are doing poorly and having trouble with ADLs due to pain. Pain refractory to NSAIDs, tylenol, opioid analgesics, ice, activity changes, quad strengthening, and steroid injection.  Dr. Shelle Iron and the patient mutually agreed to proceed with a total knee replacement. Risks and benefits of the procedure were discussed including stiffness, suboptimal range of motion, persistent pain, infection requiring removal of prosthesis and reinsertion, need for prophylactic antibiotics in the future, for example, dental procedures, possible need for manipulation, revision in the future and also anesthetic complications including DVT, PE, etc. We discussed the perioperative course, time in the hospital, postoperative recovery and the need for elevation to control swelling. We also discussed the predicted range of motion and the probability that squatting and kneeling would be unobtainable in the future. In addition, postoperative anticoagulation was discussed. We have obtained preoperative medical clearance as necessary from PCP Dr. Doristine Counter. Provided her illustrated handout and discussed it in detail. They will enroll in the total joint replacement educational forum at the hospital.  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 Has been given decolonization scripts.  Past MedicalHx Primary osteoarthritis of both knees  Diabetes Mellitus, Type II Gastroesophageal Reflux Disease Kidney  Stone  Allergies No Known Drug Allergies.   Family History Cancer. mother Congestive Heart Failure. father Alzheimer's disease. father  Social History Alcohol use. never consumed alcohol Children. 3 Current work status. working full time Drug/Alcohol Rehab (Currently). no Drug/Alcohol Rehab (Previously). no Exercise. Exercises weekly; does running / walking Illicit drug use. no Living situation. live with spouse, one-story home with 3 steps into home Marital status. married Number of flights of stairs before winded. 4-5 Pain Contract. no Tobacco / smoke exposure. yes outdoors only Tobacco use. Never smoker. Advance Directives. none  Medication History Lantus (100UNIT/ML Solution, Subcutaneous) Active. Benicar (40MG  Tablet, Oral) Active. Glimepiride (4MG  Tablet, Oral) Active. (1/2 tab BID) Aspirin (325MG  Tablet, 1 (one) Oral) Active. Multiple Vitamin (1 (one) Oral) Active. (centrum silver)  Past Surgical History Appendectomy Cataract Surgery. bilateral Colon Polyp Removal - Colonoscopy Foot Surgery. right x2 Inguinal Hernia Repair. open: right and multiple times  Review of Systems General:Not Present- Chills, Fever, Night Sweats, Fatigue, Weight Gain, Weight Loss and Memory Loss. Skin:Not Present- Hives, Itching, Rash, Eczema and Lesions. HEENT:Present- Dentures (upper). Not Present- Tinnitus, Headache, Double Vision, Visual Loss and Hearing Loss. Respiratory:Present- Cough. Not Present- Shortness of breath with exertion, Shortness of breath at rest, Allergies, Coughing up blood and Chronic Cough. Cardiovascular:Not Present- Chest Pain, Racing/skipping heartbeats, Difficulty Breathing Lying Down, Murmur, Swelling and Palpitations. Gastrointestinal:Present- Heartburn. Not Present- Bloody Stool, Abdominal Pain, Vomiting, Nausea, Constipation, Diarrhea, Difficulty Swallowing, Jaundice and Loss of appetitie. Male Genitourinary:Not  Present- Urinary frequency, Blood in Urine, Weak urinary stream, Discharge, Flank Pain, Incontinence, Painful Urination, Urgency, Urinary Retention and Urinating at Night. Musculoskeletal:Present- Joint Stiffness and Joint Pain. Not Present- Muscle Weakness, Muscle Pain, Joint Swelling, Back Pain, Morning Stiffness and Spasms. Neurological:Not Present- Tremor, Dizziness, Blackout spells, Paralysis, Difficulty with balance and  Weakness. Psychiatric:Not Present- Insomnia.  Vitals 06/06/2012 7:56 AM Weight: 229 lb Height: 66.5 in Body Surface Area: 2.21 m Body Mass Index: 36.41 kg/m Pulse: 88 (Regular) BP: 138/77 (Sitting, Left Arm, Standard)  Physical Exam The physical exam findings are as follows: General Mental Status - Alert, cooperative and good historian. General Appearance- pleasant. Not in acute distress. Orientation- Oriented X3. Build & Nutrition- Overweight. Gait- Antalgic.  Head and Neck Head- normocephalic, atraumatic . Neck Global Assessment- supple. no bruit auscultated on the right and no bruit auscultated on the left.  Eye Pupil- Bilateral- Regular and Round. Motion- Bilateral- EOMI.  Chest and Lung Exam Auscultation: Breath sounds:- clear at anterior chest wall and - clear at posterior chest wall. Adventitious sounds:- No Adventitious sounds.  Cardiovascular Auscultation:Rhythm- Regular rate and rhythm. Heart Sounds- S1 WNL and S2 WNL. Murmurs & Other Heart Sounds:Auscultation of the heart reveals - No Murmurs.  Abdomen Palpation/Percussion:Tenderness- Abdomen is non-tender to palpation. Rigidity (guarding)- Abdomen is soft. Auscultation:Auscultation of the abdomen reveals - Bowel sounds normal.  Male Genitourinary Not done, not pertinent to present illness  Musculoskeletal On exam, tender in the medial joint line. Patellofemoral pain with compression. Range is -5 to 90. Severe pain is noted with  range.   xrays with tricompartmental degenerative changes L knee. Severe, bone-on-bone medial joint space narrowing L knee, with moderate lateral joint space narrowing and PF narrowing with spurs inferior and superior aspect. Moderately severe R medial joint space narrowing.  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 Shelle Iron 06/14/12 Has been cleared by PCP Dr. Doristine Counter Again discussed risks, complications, alternatives, hospital course, post-op course. All questions answered. Will remain NPO after MN night prior to surgery. Hold diabetic meds in AM. Hold ASA and vitamins x 1 week. MRSA decolonization with chlorhexidine and bactroban Pre-op glucose was high, non-fasting. will redraw AM of surgery Plan for D/C home with home health from hospital Pt will follow up 10-14 days post-op for staple removal and xrays   Signed electronically by Dorothy Spark PA-C for Dr. Shelle Iron

## 2012-06-14 ENCOUNTER — Inpatient Hospital Stay (HOSPITAL_COMMUNITY): Payer: Medicare Other | Admitting: Anesthesiology

## 2012-06-14 ENCOUNTER — Encounter (HOSPITAL_COMMUNITY): Payer: Self-pay | Admitting: Anesthesiology

## 2012-06-14 ENCOUNTER — Encounter (HOSPITAL_COMMUNITY): Payer: Self-pay

## 2012-06-14 ENCOUNTER — Encounter (HOSPITAL_COMMUNITY)
Admission: RE | Disposition: A | Discharge status: Home Health | Payer: Self-pay | Source: Ambulatory Visit | Attending: Specialist

## 2012-06-14 ENCOUNTER — Inpatient Hospital Stay (HOSPITAL_COMMUNITY): Payer: Medicare Other

## 2012-06-14 ENCOUNTER — Inpatient Hospital Stay (HOSPITAL_COMMUNITY)
Admission: RE | Admit: 2012-06-14 | Discharge: 2012-06-17 | DRG: 470 | Disposition: A | Discharge status: Home Health | Payer: Medicare Other | Source: Ambulatory Visit | Attending: Specialist | Admitting: Specialist

## 2012-06-14 DIAGNOSIS — K219 Gastro-esophageal reflux disease without esophagitis: Secondary | ICD-10-CM | POA: Diagnosis present

## 2012-06-14 DIAGNOSIS — E119 Type 2 diabetes mellitus without complications: Secondary | ICD-10-CM | POA: Diagnosis present

## 2012-06-14 DIAGNOSIS — M1712 Unilateral primary osteoarthritis, left knee: Secondary | ICD-10-CM

## 2012-06-14 DIAGNOSIS — E663 Overweight: Secondary | ICD-10-CM | POA: Diagnosis present

## 2012-06-14 DIAGNOSIS — Z6836 Body mass index (BMI) 36.0-36.9, adult: Secondary | ICD-10-CM

## 2012-06-14 DIAGNOSIS — M1710 Unilateral primary osteoarthritis, unspecified knee: Principal | ICD-10-CM | POA: Diagnosis present

## 2012-06-14 HISTORY — PX: TOTAL KNEE ARTHROPLASTY: SHX125

## 2012-06-14 LAB — TYPE AND SCREEN
ABO/RH(D): O POS
Antibody Screen: NEGATIVE

## 2012-06-14 LAB — GLUCOSE, CAPILLARY
Glucose-Capillary: 118 mg/dL — ABNORMAL HIGH (ref 70–99)
Glucose-Capillary: 107 mg/dL — ABNORMAL HIGH (ref 70–99)
Glucose-Capillary: 122 mg/dL — ABNORMAL HIGH (ref 70–99)
Glucose-Capillary: 113 mg/dL — ABNORMAL HIGH (ref 70–99)

## 2012-06-14 SURGERY — ARTHROPLASTY, KNEE, TOTAL
Anesthesia: General | Site: Knee | Laterality: Left | Wound class: Clean

## 2012-06-14 MED ORDER — METHOCARBAMOL 500 MG PO TABS: 500.0000 mg | ORAL_TABLET | Freq: Four times a day (QID) | ORAL | Status: DC

## 2012-06-14 MED ORDER — METOCLOPRAMIDE HCL 10 MG PO TABS: 5.0000 mg | ORAL_TABLET | Freq: Three times a day (TID) | ORAL | Status: DC | PRN

## 2012-06-14 MED ORDER — VANCOMYCIN HCL 10 G IV SOLR
1500.0000 mg | Freq: Two times a day (BID) | INTRAVENOUS | Status: AC
  Administered 2012-06-14: 1500 mg via INTRAVENOUS
  Filled 2012-06-14: qty 1500

## 2012-06-14 MED ORDER — ONDANSETRON HCL 4 MG/2ML IJ SOLN
INTRAMUSCULAR | Status: DC | PRN
  Administered 2012-06-14: 4 mg via INTRAVENOUS

## 2012-06-14 MED ORDER — MENTHOL 3 MG MT LOZG: 1.0000 | LOZENGE | OROMUCOSAL | Status: DC | PRN

## 2012-06-14 MED ORDER — DOCUSATE SODIUM 100 MG PO CAPS
100.0000 mg | ORAL_CAPSULE | Freq: Two times a day (BID) | ORAL | Status: DC
  Administered 2012-06-14 – 2012-06-17 (×6): 100 mg via ORAL

## 2012-06-14 MED ORDER — INSULIN GLARGINE 100 UNIT/ML ~~LOC~~ SOLN
100.0000 [IU] | Freq: Every day | SUBCUTANEOUS | Status: DC
  Administered 2012-06-15 – 2012-06-17 (×3): 100 [IU] via SUBCUTANEOUS
  Filled 2012-06-14 (×3): qty 1

## 2012-06-14 MED ORDER — HYDROMORPHONE HCL PF 1 MG/ML IJ SOLN
1.0000 mg | INTRAMUSCULAR | Status: DC | PRN
  Administered 2012-06-14 – 2012-06-15 (×3): 1 mg via INTRAVENOUS
  Filled 2012-06-14 (×3): qty 1

## 2012-06-14 MED ORDER — HYDROMORPHONE HCL PF 1 MG/ML IJ SOLN
INTRAMUSCULAR | Status: DC | PRN
  Administered 2012-06-14 (×4): .4 mg via INTRAVENOUS

## 2012-06-14 MED ORDER — IRBESARTAN 75 MG PO TABS
75.0000 mg | ORAL_TABLET | Freq: Every day | ORAL | Status: DC
  Administered 2012-06-14 – 2012-06-17 (×4): 75 mg via ORAL
  Filled 2012-06-14 (×4): qty 1

## 2012-06-14 MED ORDER — OXYCODONE-ACETAMINOPHEN 5-325 MG PO TABS: 1.0000 | ORAL_TABLET | ORAL | Status: DC | PRN

## 2012-06-14 MED ORDER — LACTATED RINGERS IV SOLN
INTRAVENOUS | Status: DC
  Administered 2012-06-14: 11:00:00 via INTRAVENOUS

## 2012-06-14 MED ORDER — BUPIVACAINE-EPINEPHRINE 0.5% -1:200000 IJ SOLN
INTRAMUSCULAR | Status: DC | PRN
  Administered 2012-06-14: 15 mL

## 2012-06-14 MED ORDER — FENTANYL CITRATE 0.05 MG/ML IJ SOLN
INTRAMUSCULAR | Status: DC | PRN
  Administered 2012-06-14: 100 ug via INTRAVENOUS
  Administered 2012-06-14: 50 ug via INTRAVENOUS
  Administered 2012-06-14: 100 ug via INTRAVENOUS

## 2012-06-14 MED ORDER — GLYCOPYRROLATE 0.2 MG/ML IJ SOLN
INTRAMUSCULAR | Status: DC | PRN
  Administered 2012-06-14: 0.6 mg via INTRAVENOUS

## 2012-06-14 MED ORDER — PSYLLIUM 95 % PO PACK
1.0000 | PACK | Freq: Every day | ORAL | Status: DC
  Administered 2012-06-15 – 2012-06-16 (×2): 1 via ORAL
  Filled 2012-06-14 (×3): qty 1

## 2012-06-14 MED ORDER — POTASSIUM CHLORIDE 2 MEQ/ML IV SOLN
INTRAVENOUS | Status: AC
  Filled 2012-06-14 (×2): qty 1000

## 2012-06-14 MED ORDER — PROPOFOL 10 MG/ML IV BOLUS
INTRAVENOUS | Status: DC | PRN
  Administered 2012-06-14: 200 mg via INTRAVENOUS

## 2012-06-14 MED ORDER — OXYCODONE HCL 5 MG PO TABS
5.0000 mg | ORAL_TABLET | ORAL | Status: DC | PRN
  Administered 2012-06-14 – 2012-06-16 (×9): 10 mg via ORAL
  Filled 2012-06-14 (×9): qty 2

## 2012-06-14 MED ORDER — HYDROMORPHONE HCL PF 1 MG/ML IJ SOLN: 0.2500 mg | INTRAMUSCULAR | Status: DC | PRN

## 2012-06-14 MED ORDER — METHOCARBAMOL 100 MG/ML IJ SOLN: 500.0000 mg | Freq: Four times a day (QID) | INTRAVENOUS | Status: DC | PRN

## 2012-06-14 MED ORDER — SODIUM CHLORIDE 0.9 % IR SOLN
Status: DC | PRN
  Administered 2012-06-14: 11:00:00

## 2012-06-14 MED ORDER — NEOSTIGMINE METHYLSULFATE 1 MG/ML IJ SOLN
INTRAMUSCULAR | Status: DC | PRN
  Administered 2012-06-14: 3.5 mg via INTRAVENOUS

## 2012-06-14 MED ORDER — ACETAMINOPHEN 650 MG RE SUPP: 650.0000 mg | Freq: Four times a day (QID) | RECTAL | Status: DC | PRN

## 2012-06-14 MED ORDER — RIVAROXABAN 10 MG PO TABS: 10.0000 mg | ORAL_TABLET | Freq: Every day | ORAL | Status: DC

## 2012-06-14 MED ORDER — INSULIN ASPART 100 UNIT/ML ~~LOC~~ SOLN
0.0000 [IU] | Freq: Every day | SUBCUTANEOUS | Status: DC
  Administered 2012-06-14: 0 [IU] via SUBCUTANEOUS
  Administered 2012-06-15: 2 [IU] via SUBCUTANEOUS

## 2012-06-14 MED ORDER — CEFAZOLIN SODIUM-DEXTROSE 2-3 GM-% IV SOLR
2.0000 g | INTRAVENOUS | Status: AC
  Administered 2012-06-14: 2 g via INTRAVENOUS

## 2012-06-14 MED ORDER — SUCCINYLCHOLINE CHLORIDE 20 MG/ML IJ SOLN
INTRAMUSCULAR | Status: DC | PRN
  Administered 2012-06-14: 150 mg via INTRAVENOUS

## 2012-06-14 MED ORDER — LACTATED RINGERS IV SOLN
INTRAVENOUS | Status: DC | PRN
  Administered 2012-06-14: 11:00:00 via INTRAVENOUS

## 2012-06-14 MED ORDER — LACTATED RINGERS IV SOLN: INTRAVENOUS | Status: DC

## 2012-06-14 MED ORDER — ACETAMINOPHEN 325 MG PO TABS
650.0000 mg | ORAL_TABLET | Freq: Four times a day (QID) | ORAL | Status: DC | PRN
  Administered 2012-06-16: 650 mg via ORAL
  Filled 2012-06-14: qty 2

## 2012-06-14 MED ORDER — ACETAMINOPHEN 10 MG/ML IV SOLN
INTRAVENOUS | Status: DC | PRN
  Administered 2012-06-14: 1000 mg via INTRAVENOUS

## 2012-06-14 MED ORDER — KETAMINE HCL 10 MG/ML IJ SOLN
INTRAMUSCULAR | Status: DC | PRN
  Administered 2012-06-14 (×2): 25 mg via INTRAVENOUS

## 2012-06-14 MED ORDER — ONDANSETRON HCL 4 MG PO TABS: 4.0000 mg | ORAL_TABLET | Freq: Four times a day (QID) | ORAL | Status: DC | PRN

## 2012-06-14 MED ORDER — CISATRACURIUM BESYLATE (PF) 10 MG/5ML IV SOLN
INTRAVENOUS | Status: DC | PRN
  Administered 2012-06-14: 10 mg via INTRAVENOUS

## 2012-06-14 MED ORDER — VANCOMYCIN HCL 10 G IV SOLR
1500.0000 mg | INTRAVENOUS | Status: AC
  Administered 2012-06-14 (×2): 1500 mg via INTRAVENOUS
  Filled 2012-06-14: qty 1500

## 2012-06-14 MED ORDER — PHENOL 1.4 % MT LIQD: 1.0000 | OROMUCOSAL | Status: DC | PRN

## 2012-06-14 MED ORDER — INSULIN ASPART 100 UNIT/ML ~~LOC~~ SOLN
0.0000 [IU] | Freq: Three times a day (TID) | SUBCUTANEOUS | Status: DC
  Administered 2012-06-14: 2 [IU] via SUBCUTANEOUS
  Administered 2012-06-15: 5 [IU] via SUBCUTANEOUS
  Administered 2012-06-15: 3 [IU] via SUBCUTANEOUS
  Administered 2012-06-15: 5 [IU] via SUBCUTANEOUS
  Administered 2012-06-16 (×2): 3 [IU] via SUBCUTANEOUS

## 2012-06-14 MED ORDER — CEFAZOLIN SODIUM-DEXTROSE 2-3 GM-% IV SOLR
2.0000 g | Freq: Four times a day (QID) | INTRAVENOUS | Status: AC
  Administered 2012-06-14 – 2012-06-15 (×3): 2 g via INTRAVENOUS
  Filled 2012-06-14 (×3): qty 50

## 2012-06-14 MED ORDER — METHOCARBAMOL 500 MG PO TABS
500.0000 mg | ORAL_TABLET | Freq: Four times a day (QID) | ORAL | Status: DC | PRN
  Administered 2012-06-15 (×4): 500 mg via ORAL
  Filled 2012-06-14 (×4): qty 1

## 2012-06-14 MED ORDER — SODIUM CHLORIDE 0.45 % IV SOLN
INTRAVENOUS | Status: DC
  Administered 2012-06-14: 1000 mL via INTRAVENOUS

## 2012-06-14 MED ORDER — METOCLOPRAMIDE HCL 5 MG/ML IJ SOLN: 5.0000 mg | Freq: Three times a day (TID) | INTRAMUSCULAR | Status: DC | PRN

## 2012-06-14 MED ORDER — RIVAROXABAN 10 MG PO TABS
10.0000 mg | ORAL_TABLET | Freq: Every day | ORAL | Status: DC
  Administered 2012-06-15 – 2012-06-17 (×3): 10 mg via ORAL
  Filled 2012-06-14 (×4): qty 1

## 2012-06-14 MED ORDER — LIDOCAINE HCL (CARDIAC) 20 MG/ML IV SOLN
INTRAVENOUS | Status: DC | PRN
  Administered 2012-06-14: 100 mg via INTRAVENOUS

## 2012-06-14 MED ORDER — GLIMEPIRIDE 2 MG PO TABS
2.0000 mg | ORAL_TABLET | Freq: Two times a day (BID) | ORAL | Status: DC
  Administered 2012-06-15 – 2012-06-17 (×5): 2 mg via ORAL
  Filled 2012-06-14 (×7): qty 1

## 2012-06-14 MED ORDER — ONDANSETRON HCL 4 MG/2ML IJ SOLN
4.0000 mg | Freq: Four times a day (QID) | INTRAMUSCULAR | Status: DC | PRN
  Administered 2012-06-14: 4 mg via INTRAVENOUS
  Filled 2012-06-14: qty 2

## 2012-06-14 MED ORDER — SODIUM CHLORIDE 0.9 % IR SOLN
Status: DC | PRN
  Administered 2012-06-14: 1000 mL

## 2012-06-14 SURGICAL SUPPLY — 67 items
BAG SPEC THK2 15X12 ZIP CLS (MISCELLANEOUS) ×1
BAG ZIPLOCK 12X15 (MISCELLANEOUS) ×2 IMPLANT
BANDAGE ELASTIC 4 VELCRO ST LF (GAUZE/BANDAGES/DRESSINGS) ×2 IMPLANT
BANDAGE ELASTIC 6 VELCRO ST LF (GAUZE/BANDAGES/DRESSINGS) ×2 IMPLANT
BANDAGE ESMARK 6X9 LF (GAUZE/BANDAGES/DRESSINGS) ×1 IMPLANT
BLADE SAG 18X100X1.27 (BLADE) ×2 IMPLANT
BLADE SAW SGTL 13.0X1.19X90.0M (BLADE) ×2 IMPLANT
BNDG CMPR 9X6 STRL LF SNTH (GAUZE/BANDAGES/DRESSINGS) ×1
BNDG ESMARK 6X9 LF (GAUZE/BANDAGES/DRESSINGS) ×2
CEMENT HV SMART SET (Cement) ×4 IMPLANT
CHLORAPREP W/TINT 26ML (MISCELLANEOUS) IMPLANT
CLOTH BEACON ORANGE TIMEOUT ST (SAFETY) ×2 IMPLANT
CUFF TOURN SGL QUICK 34 (TOURNIQUET CUFF) ×2
CUFF TRNQT CYL 34X4X40X1 (TOURNIQUET CUFF) ×1 IMPLANT
DECANTER SPIKE VIAL GLASS SM (MISCELLANEOUS) ×2 IMPLANT
DRAPE INCISE IOBAN 66X45 STRL (DRAPES) ×1 IMPLANT
DRAPE LG THREE QUARTER DISP (DRAPES) ×2 IMPLANT
DRAPE ORTHO SPLIT 77X108 STRL (DRAPES) ×4
DRAPE POUCH INSTRU U-SHP 10X18 (DRAPES) ×2 IMPLANT
DRAPE SURG ORHT 6 SPLT 77X108 (DRAPES) ×2 IMPLANT
DRAPE U-SHAPE 47X51 STRL (DRAPES) ×2 IMPLANT
DRSG ADAPTIC 3X8 NADH LF (GAUZE/BANDAGES/DRESSINGS) ×2 IMPLANT
DRSG AQUACEL AG ADV 3.5X10 (GAUZE/BANDAGES/DRESSINGS) ×1 IMPLANT
DRSG PAD ABDOMINAL 8X10 ST (GAUZE/BANDAGES/DRESSINGS) ×2 IMPLANT
DRSG TEGADERM 4X4.75 (GAUZE/BANDAGES/DRESSINGS) ×1 IMPLANT
DURAPREP 26ML APPLICATOR (WOUND CARE) ×2 IMPLANT
ELECT REM PT RETURN 9FT ADLT (ELECTROSURGICAL) ×2
ELECTRODE REM PT RTRN 9FT ADLT (ELECTROSURGICAL) ×1 IMPLANT
EVACUATOR 1/8 PVC DRAIN (DRAIN) ×2 IMPLANT
FACESHIELD LNG OPTICON STERILE (SAFETY) ×10 IMPLANT
GAUZE SPONGE 2X2 8PLY STRL LF (GAUZE/BANDAGES/DRESSINGS) IMPLANT
GLOVE BIOGEL PI IND STRL 7.5 (GLOVE) ×1 IMPLANT
GLOVE BIOGEL PI IND STRL 8 (GLOVE) ×1 IMPLANT
GLOVE BIOGEL PI INDICATOR 7.5 (GLOVE) ×1
GLOVE BIOGEL PI INDICATOR 8 (GLOVE) ×1
GLOVE SURG SS PI 7.5 STRL IVOR (GLOVE) ×2 IMPLANT
GLOVE SURG SS PI 8.0 STRL IVOR (GLOVE) ×4 IMPLANT
GOWN PREVENTION PLUS LG XLONG (DISPOSABLE) ×2 IMPLANT
GOWN PREVENTION PLUS XLARGE (GOWN DISPOSABLE) ×2 IMPLANT
GOWN STRL REIN XL XLG (GOWN DISPOSABLE) ×4 IMPLANT
HANDPIECE INTERPULSE COAX TIP (DISPOSABLE) ×2
IMMOBILIZER KNEE 20 (SOFTGOODS) ×2
IMMOBILIZER KNEE 20 THIGH 36 (SOFTGOODS) ×1 IMPLANT
KIT BASIN OR (CUSTOM PROCEDURE TRAY) ×2 IMPLANT
MANIFOLD NEPTUNE II (INSTRUMENTS) ×2 IMPLANT
NEEDLE 27GAX1X1/2 (NEEDLE) ×1 IMPLANT
NS IRRIG 1000ML POUR BTL (IV SOLUTION) ×1 IMPLANT
PACK TOTAL JOINT (CUSTOM PROCEDURE TRAY) ×2 IMPLANT
PADDING CAST COTTON 6X4 STRL (CAST SUPPLIES) ×2 IMPLANT
POSITIONER SURGICAL ARM (MISCELLANEOUS) ×2 IMPLANT
SET HNDPC FAN SPRY TIP SCT (DISPOSABLE) ×1 IMPLANT
SPONGE GAUZE 2X2 STER 10/PKG (GAUZE/BANDAGES/DRESSINGS) ×1
SPONGE SURGIFOAM ABS GEL 100 (HEMOSTASIS) ×2 IMPLANT
STAPLER VISISTAT (STAPLE) ×2 IMPLANT
SUCTION FRAZIER 12FR DISP (SUCTIONS) ×2 IMPLANT
SUT BONE WAX W31G (SUTURE) ×2 IMPLANT
SUT VIC AB 1 CT1 27 (SUTURE) ×2
SUT VIC AB 1 CT1 27XBRD ANTBC (SUTURE) ×4 IMPLANT
SUT VIC AB 2-0 CT1 27 (SUTURE) ×6
SUT VIC AB 2-0 CT1 TAPERPNT 27 (SUTURE) ×3 IMPLANT
SUT VLOC 180 0 24IN GS25 (SUTURE) ×2 IMPLANT
SYR 30ML LL (SYRINGE) ×2 IMPLANT
TOWEL OR 17X26 10 PK STRL BLUE (TOWEL DISPOSABLE) ×2 IMPLANT
TOWER CARTRIDGE SMART MIX (DISPOSABLE) ×2 IMPLANT
TRAY FOLEY CATH 14FRSI W/METER (CATHETERS) ×2 IMPLANT
WATER STERILE IRR 1500ML POUR (IV SOLUTION) ×3 IMPLANT
WRAP KNEE MAXI GEL POST OP (GAUZE/BANDAGES/DRESSINGS) ×2 IMPLANT

## 2012-06-14 NOTE — Preoperative (Signed)
Beta Blockers   Reason not to administer Beta Blockers:Not Applicable 

## 2012-06-14 NOTE — Anesthesia Preprocedure Evaluation (Addendum)
Anesthesia Evaluation  Patient identified by MRN, date of birth, ID band Patient awake    Reviewed: Allergy & Precautions, H&P , NPO status , Patient's Chart, lab work & pertinent test results  Airway Mallampati: II TM Distance: >3 FB Neck ROM: full    Dental no notable dental hx. (+) Edentulous Upper, Missing and Dental Advisory Given Lower back and side teeth missing too:   Pulmonary neg pulmonary ROS,  breath sounds clear to auscultation  Pulmonary exam normal       Cardiovascular hypertension, Pt. on medications + angina + CAD Rhythm:regular Rate:Normal  Has been evaluated by cardiology and there is no beneficial intervention for this anxiety related CP   Neuro/Psych negative neurological ROS  negative psych ROS   GI/Hepatic negative GI ROS, Neg liver ROS,   Endo/Other  diabetes, Well Controlled, Type 2, Insulin Dependent  Renal/GU negative Renal ROS  negative genitourinary   Musculoskeletal   Abdominal   Peds  Hematology negative hematology ROS (+)   Anesthesia Other Findings   Reproductive/Obstetrics negative OB ROS                          Anesthesia Physical Anesthesia Plan  ASA: III  Anesthesia Plan: General   Post-op Pain Management:    Induction: Intravenous  Airway Management Planned: Oral ETT  Additional Equipment:   Intra-op Plan:   Post-operative Plan: Extubation in OR  Informed Consent: I have reviewed the patients History and Physical, chart, labs and discussed the procedure including the risks, benefits and alternatives for the proposed anesthesia with the patient or authorized representative who has indicated his/her understanding and acceptance.   Dental Advisory Given  Plan Discussed with: CRNA and Surgeon  Anesthesia Plan Comments:         Anesthesia Quick Evaluation

## 2012-06-14 NOTE — Anesthesia Postprocedure Evaluation (Signed)
  Anesthesia Post-op Note  Patient: [name]  Procedure(s) Performed: Procedure(s) (LRB): LEFT TOTAL KNEE ARTHROPLASTY (Left)  Patient Location: PACU  Anesthesia Type: General  Level of Consciousness: awake and alert   Airway and Oxygen Therapy: Patient Spontanous Breathing  Post-op Pain: mild  Post-op Assessment: Post-op Vital signs reviewed, Patient's Cardiovascular Status Stable, Respiratory Function Stable, Patent Airway and No signs of Nausea or vomiting  Last Vitals:  Filed Vitals:   06/14/12 1348  BP: 143/63  Pulse:   Temp:   Resp:     Post-op Vital Signs: stable   Complications: No apparent anesthesia complications

## 2012-06-14 NOTE — Brief Op Note (Signed)
06/14/2012  1:06 PM  PATIENT:  [name]  67 y.o. male  PRE-OPERATIVE DIAGNOSIS:  Left Knee DJD  POST-OPERATIVE DIAGNOSIS:  Left Knee DJD  PROCEDURE:  Procedure(s): LEFT TOTAL KNEE ARTHROPLASTY (Left)  SURGEON:  Surgeon(s) and Role:    * Javier Docker, MD - Primary  PHYSICIAN ASSISTANT:   ASSISTANTS: Bissell   ANESTHESIA:   general  EBL:  Total I/O In: -  Out: 400 [Urine:400]  BLOOD ADMINISTERED:none  DRAINS: none   LOCAL MEDICATIONS USED:  MARCAINE     SPECIMEN:  No Specimen  DISPOSITION OF SPECIMEN:  N/A  COUNTS:  YES  TOURNIQUET:   Total Tourniquet Time Documented: Thigh (Left) - 96 minutes Total: Thigh (Left) - 96 minutes   DICTATION: .Other Dictation: Dictation Number [id number]  PLAN OF CARE: Admit to inpatient   PATIENT DISPOSITION:  PACU - hemodynamically stable.   Delay start of Pharmacological VTE agent (>24hrs) due to surgical blood loss or risk of bleeding: no

## 2012-06-14 NOTE — Transfer of Care (Signed)
Immediate Anesthesia Transfer of Care Note  Patient: [name]  Procedure(s) Performed: Procedure(s): LEFT TOTAL KNEE ARTHROPLASTY (Left)  Patient Location: PACU  Anesthesia Type:General  Level of Consciousness: awake, sedated and patient cooperative  Airway & Oxygen Therapy: Patient Spontanous Breathing and Patient connected to face mask oxygen  Post-op Assessment: Report given to PACU RN, Post -op Vital signs reviewed and stable and Patient moving all extremities X 4  Post vital signs: Reviewed and stable  Complications: No apparent anesthesia complications

## 2012-06-14 NOTE — Interval H&P Note (Signed)
History and Physical Interval Note:  06/14/2012 10:16 AM  [name]  has presented today for surgery, with the diagnosis of Left Knee DJD  The various methods of treatment have been discussed with the patient and family. After consideration of risks, benefits and other options for treatment, the patient has consented to  Procedure(s): LEFT TOTAL KNEE ARTHROPLASTY (Left) as a surgical intervention .  The patient's history has been reviewed, patient examined, no change in status, stable for surgery.  I have reviewed the patient's chart and labs.  Questions were answered to the patient's satisfaction.     Selestino Nila C

## 2012-06-14 NOTE — H&P (View-Only) (Signed)
[name] DOB: 04/05/1945 H&P date 06/06/12  Chief Complaint: left knee pain.  History of Present Illness The patient is a 67 year old male who comes in today for a preoperative History and Physical. The patient is scheduled for a left total knee arthroplasty to be performed by Dr. Jeffrey C. Beane, MD at [facility] on June 14, 2012. Patient c/o severe left knee pain without injury. Notes pain, swelling, stiffness, popping, giving way and instability. The patient feels that they are doing poorly and having trouble with ADLs due to pain. Pain refractory to NSAIDs, tylenol, opioid analgesics, ice, activity changes, quad strengthening, and steroid injection.  Dr. Beane and the patient mutually agreed to proceed with a total knee replacement. Risks and benefits of the procedure were discussed including stiffness, suboptimal range of motion, persistent pain, infection requiring removal of prosthesis and reinsertion, need for prophylactic antibiotics in the future, for example, dental procedures, possible need for manipulation, revision in the future and also anesthetic complications including DVT, PE, etc. We discussed the perioperative course, time in the hospital, postoperative recovery and the need for elevation to control swelling. We also discussed the predicted range of motion and the probability that squatting and kneeling would be unobtainable in the future. In addition, postoperative anticoagulation was discussed. We have obtained preoperative medical clearance as necessary from PCP Dr. Burnett. Provided her illustrated handout and discussed it in detail. They will enroll in the total joint replacement educational forum at the hospital.  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 Has been given decolonization scripts.  Past MedicalHx Primary osteoarthritis of both knees  Diabetes Mellitus, Type II Gastroesophageal Reflux Disease Kidney  Stone  Allergies No Known Drug Allergies.   Family History Cancer. mother Congestive Heart Failure. father Alzheimer's disease. father  Social History Alcohol use. never consumed alcohol Children. 3 Current work status. working full time Drug/Alcohol Rehab (Currently). no Drug/Alcohol Rehab (Previously). no Exercise. Exercises weekly; does running / walking Illicit drug use. no Living situation. live with spouse, one-story home with 3 steps into home Marital status. married Number of flights of stairs before winded. 4-5 Pain Contract. no Tobacco / smoke exposure. yes outdoors only Tobacco use. Never smoker. Advance Directives. none  Medication History Lantus (100UNIT/ML Solution, Subcutaneous) Active. Benicar (40MG Tablet, Oral) Active. Glimepiride (4MG Tablet, Oral) Active. (1/2 tab BID) Aspirin (325MG Tablet, 1 (one) Oral) Active. Multiple Vitamin (1 (one) Oral) Active. (centrum silver)  Past Surgical History Appendectomy Cataract Surgery. bilateral Colon Polyp Removal - Colonoscopy Foot Surgery. right x2 Inguinal Hernia Repair. open: right and multiple times  Review of Systems General:Not Present- Chills, Fever, Night Sweats, Fatigue, Weight Gain, Weight Loss and Memory Loss. Skin:Not Present- Hives, Itching, Rash, Eczema and Lesions. HEENT:Present- Dentures (upper). Not Present- Tinnitus, Headache, Double Vision, Visual Loss and Hearing Loss. Respiratory:Present- Cough. Not Present- Shortness of breath with exertion, Shortness of breath at rest, Allergies, Coughing up blood and Chronic Cough. Cardiovascular:Not Present- Chest Pain, Racing/skipping heartbeats, Difficulty Breathing Lying Down, Murmur, Swelling and Palpitations. Gastrointestinal:Present- Heartburn. Not Present- Bloody Stool, Abdominal Pain, Vomiting, Nausea, Constipation, Diarrhea, Difficulty Swallowing, Jaundice and Loss of appetitie. Male Genitourinary:Not  Present- Urinary frequency, Blood in Urine, Weak urinary stream, Discharge, Flank Pain, Incontinence, Painful Urination, Urgency, Urinary Retention and Urinating at Night. Musculoskeletal:Present- Joint Stiffness and Joint Pain. Not Present- Muscle Weakness, Muscle Pain, Joint Swelling, Back Pain, Morning Stiffness and Spasms. Neurological:Not Present- Tremor, Dizziness, Blackout spells, Paralysis, Difficulty with balance and   Weakness. Psychiatric:Not Present- Insomnia.  Vitals 06/06/2012 7:56 AM Weight: 229 lb Height: 66.5 in Body Surface Area: 2.21 m Body Mass Index: 36.41 kg/m Pulse: 88 (Regular) BP: 138/77 (Sitting, Left Arm, Standard)  Physical Exam The physical exam findings are as follows: General Mental Status - Alert, cooperative and good historian. General Appearance- pleasant. Not in acute distress. Orientation- Oriented X3. Build & Nutrition- Overweight. Gait- Antalgic.  Head and Neck Head- normocephalic, atraumatic . Neck Global Assessment- supple. no bruit auscultated on the right and no bruit auscultated on the left.  Eye Pupil- Bilateral- Regular and Round. Motion- Bilateral- EOMI.  Chest and Lung Exam Auscultation: Breath sounds:- clear at anterior chest wall and - clear at posterior chest wall. Adventitious sounds:- No Adventitious sounds.  Cardiovascular Auscultation:Rhythm- Regular rate and rhythm. Heart Sounds- S1 WNL and S2 WNL. Murmurs & Other Heart Sounds:Auscultation of the heart reveals - No Murmurs.  Abdomen Palpation/Percussion:Tenderness- Abdomen is non-tender to palpation. Rigidity (guarding)- Abdomen is soft. Auscultation:Auscultation of the abdomen reveals - Bowel sounds normal.  Male Genitourinary Not done, not pertinent to present illness  Musculoskeletal On exam, tender in the medial joint line. Patellofemoral pain with compression. Range is -5 to 90. Severe pain is noted with  range.   xrays with tricompartmental degenerative changes L knee. Severe, bone-on-bone medial joint space narrowing L knee, with moderate lateral joint space narrowing and PF narrowing with spurs inferior and superior aspect. Moderately severe R medial joint space narrowing.  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 Beane 06/14/12 Has been cleared by PCP Dr. Burnett Again discussed risks, complications, alternatives, hospital course, post-op course. All questions answered. Will remain NPO after MN night prior to surgery. Hold diabetic meds in AM. Hold ASA and vitamins x 1 week. MRSA decolonization with chlorhexidine and bactroban Pre-op glucose was high, non-fasting. will redraw AM of surgery Plan for D/C home with home health from hospital Pt will follow up 10-14 days post-op for staple removal and xrays   Signed electronically by Adaya Garmany M Dorrie Cocuzza PA-C for Dr. Beane  

## 2012-06-14 NOTE — Progress Notes (Signed)
Utilization review completed.  

## 2012-06-15 LAB — CBC
HCT: 38.1 % — ABNORMAL LOW (ref 39.0–52.0)
Hemoglobin: 12.8 g/dL — ABNORMAL LOW (ref 13.0–17.0)
MCH: 31 pg (ref 26.0–34.0)
MCV: 92.3 fL (ref 78.0–100.0)
Platelets: 173 10*3/uL (ref 150–400)
RBC: 4.13 MIL/uL — ABNORMAL LOW (ref 4.22–5.81)
WBC: 9.9 10*3/uL (ref 4.0–10.5)

## 2012-06-15 LAB — BASIC METABOLIC PANEL
BUN: 16 mg/dL (ref 6–23)
CO2: 30 mEq/L (ref 19–32)
Chloride: 96 mEq/L (ref 96–112)
Creatinine, Ser: 1.31 mg/dL (ref 0.50–1.35)
Glucose, Bld: 200 mg/dL — ABNORMAL HIGH (ref 70–99)

## 2012-06-15 LAB — GLUCOSE, CAPILLARY: Glucose-Capillary: 219 mg/dL — ABNORMAL HIGH (ref 70–99)

## 2012-06-15 MED ORDER — GUAIFENESIN 100 MG/5ML PO SOLN
200.0000 mg | ORAL | Status: DC | PRN
  Administered 2012-06-15: 200 mg via ORAL
  Filled 2012-06-15: qty 10

## 2012-06-15 MED ORDER — ALUM & MAG HYDROXIDE-SIMETH 200-200-20 MG/5ML PO SUSP
30.0000 mL | ORAL | Status: DC | PRN
  Administered 2012-06-15: 30 mL via ORAL
  Filled 2012-06-15: qty 30

## 2012-06-15 MED ORDER — GUAIFENESIN ER 600 MG PO TB12
600.0000 mg | ORAL_TABLET | Freq: Two times a day (BID) | ORAL | Status: DC
Start: 2012-06-15 — End: 2012-06-17
  Administered 2012-06-15 – 2012-06-17 (×4): 600 mg via ORAL
  Filled 2012-06-15 (×5): qty 1

## 2012-06-15 MED ORDER — PANTOPRAZOLE SODIUM 40 MG PO TBEC
40.0000 mg | DELAYED_RELEASE_TABLET | Freq: Every day | ORAL | Status: DC
  Administered 2012-06-15 – 2012-06-17 (×3): 40 mg via ORAL
  Filled 2012-06-15 (×3): qty 1

## 2012-06-15 NOTE — Progress Notes (Signed)
HHPT services to be provided by [facility]. Rolling walker to be delivered to room as well.  Algernon Huxley, RN BSN  Algernon Huxley RN BSN  [phone]

## 2012-06-15 NOTE — Op Note (Signed)
[name], [name]                ACCOUNT NO.:  [id number]  MEDICAL RECORD NO.:  [id number]  LOCATION:  1603                         FACILITY:  [facility]  PHYSICIAN:  Jene Every, M.D.    DATE OF BIRTH:  08/06/1945  DATE OF PROCEDURE:  06/14/2012 DATE OF DISCHARGE:                              OPERATIVE REPORT   PREOPERATIVE DIAGNOSIS:  Degenerative joint disease, end-stage osteoarthritis of the left knee.  POSTOPERATIVE DIAGNOSIS:  Degenerative joint disease, end-stage osteoarthritis of the left knee.  PROCEDURE PERFORMED:  Left total knee arthroplasty.  ANESTHESIA:  General.  ASSISTANT:  Lanna Poche, PA.  COMPONENTS:  DePuy rotating platform, 4 femur, 4 tibia, 12 mm insert, 38 patella.  HISTORY:  This is a 67 year old with end-stage bone-on-bone osteoarthrosis of the medial compartment of the left knee.  The patient had disabling pain despite rest, activity modification, corticosteroid injections, physical therapy, and significant disabilities with ambulation, completing his ADLs.  He was indicated for replacement to reduce his pain and increase his level of ADLs and functional capabilities.  Risk and benefits were discussed including bleeding, infection, damage to neurovascular structures, DVT, PE, anesthetic complications, suboptimal range of motion, need for revision etc.  TECHNIQUE:  With the patient in supine position, after induction of adequate general anesthesia, 2 g of vancomycin and 2 g of Kefzol, left lower extremity was prepped and draped in the usual sterile fashion with thigh tourniquet inflated to 300 mmHg.  Midline incision was made over the patella with the knee flexed.  Significant scarring was noted in the subcutaneous bursal tissue.  Median parapatellar arthrotomy was performed.  Full-thickness flaps were developed.  The patella was everted and the knee flexed.  Tricompartmental severe osteoporosis was noted.  Bone osteophytes were removed with  the rongeur.  We elevated the soft tissue medially preserving the MCL and remnants of medial and lateral menisci.  ACL was removed.  Geniculate was cauterized.  Step drill entered the femur, irrigated, T-handle placed, 5-degree left intramedullary guide, 11 off the distal femur.  So therefore, with slight flexion contracture, distal femoral cut performed.  We measured off the anterior femur to a 4, __________ 3 degrees of external rotation.  Performed the anterior, posterior, and chamfer cuts. Following this, we subluxed the patella.  It had significant osteophytes and fracture posteriorly.  Bone exposed low point medially.  External alignment guide, __________ tibia bisecting the ankle, 4 off the femur, 2 mm slope, oscillating saw performed this cut __________ flexion- extension gap.  It was tight in flexion, found osteophytes posteriorly protecting the soft tissues.  Used osteotome to remove the osteophytes. Then the flexion-extension gaps were equivalent.  We turned our attention towards completing the tibia.  It was maximizing to 4 just the medial aspect of the tibial tubercle, pinned, drilled and punch guide utilized, and completed the femur.  Box cut performed using 4 bisecting the canal.  __________ flushed with the lateral aspect of the condyle. We then placed a trial femur and tibia.  First with 10 mm, which gave him some hyperextension and then 12, which was to neutral full flexion and extension, good stability, varus valgus stressing to 0-30 degrees. Patella was everted.  Measured to 26.  We __________ it to 17, used the 38 patella medializing the drill holes.  The trial was placed and he had excellent patellofemoral tracking.  All instrumentation was removed.  We checked posteriorly removing any residual menisci, cauterizing the geniculate, capsule was intact.  Flexed the knee, __________ dried all surfaces, mixed the cement in the back table in appropriate fashion after using  pulsatile lavage cleaning the joint, and dried surfaces, we injected cement into the tibial canal and digitally pressurized it. Then, we cemented and impacted the tibial tray on the tibia.  Redundant cement removed.  We cemented the femur after drying it and impacted the femoral component, placed a 12-mm insert, reduced it and held in axial load throughout the curing of the cement, cemented the patellar button as well.  After curing of the cement, removed any redundant cement with an osteotome.  Checked flexion, extension, full.  Good stability to varus valgus stressing 0-30 degrees and excellent patellofemoral tracking.  With the 12-mm insert, we removed it and posteriorly removed any redundant cement.  We copiously irrigated the wound.  We placed in a permanent 12 mm insert.  Again full extension, full flexion, good stability with varus valgus stressing to 0-30 degrees and excellent patellofemoral tracking.  We then placed Hemovac and brought out through a lateral stab wound of skin.  Approximated the patellar arthrotomy with 1 Vicryl interrupted figure-of-eight sutures.  Then oversewn with running V-Loc.  Subcu with 0 and 2-0, the skin with staples.  Wound was dressed sterilely.  Had flexion to 90 degrees following closure and again good tracking, good flexion and extension.  The wound was dressed sterilely.  Tourniquet was deflated with adequate vascularization of lower extremity appreciated.  Prior to that, 0.25% Marcaine with epinephrine was infiltrated in the periosteum of the femur.  Good pulses distally and transported to the recovery room in satisfactory condition. The patient tolerated the procedure well.  No complications.  Tourniquet time was 96 minutes.  Minimal blood loss.     Jene Every, M.D.     Cordelia Pen  D:  06/14/2012  T:  06/15/2012  Job:  [id number]

## 2012-06-15 NOTE — Progress Notes (Signed)
Inpatient Diabetes Program Recommendations  AACE/ADA: New Consensus Statement on Inpatient Glycemic Control (2013)  Target Ranges:  Prepandial:   less than 140 mg/dL      Peak postprandial:   less than 180 mg/dL (1-2 hours)      Critically ill patients:  140 - 180 mg/dL   Reason for Visit: Hyperglycemia  Results for [name], [name] (MRN [id number]) as of 06/15/2012 12:29  Ref. Range 06/15/2012 05:00  Sodium Latest Range: 135-145 mEq/L 134 (L)  Potassium Latest Range: 3.5-5.1 mEq/L 3.8  Chloride Latest Range: 96-112 mEq/L 96  CO2 Latest Range: 19-32 mEq/L 30  BUN Latest Range: 6-23 mg/dL 16  Creatinine Latest Range: 0.50-1.35 mg/dL 4.09  Calcium Latest Range: 8.4-10.5 mg/dL 8.1 (L)  GFR calc non Af Amer Latest Range: >90 mL/min 54 (L)  GFR calc Af Amer Latest Range: >90 mL/min 63 (L)  Glucose Latest Range: 70-99 mg/dL 811 (H)     Inpatient Diabetes Program Recommendations HgbA1C: Check HgbA1C to assess glycemic control prior to hospitalization  Note: Will follow.  Thank you. Ailene Ards, RD, LDN, CDE Inpatient Diabetes Coordinator [phone]

## 2012-06-15 NOTE — Progress Notes (Signed)
Patient ID: RIYAD KEENA, male   DOB: [birth year], 67 y.o.   MRN: [id number] Subjective: 1 Day Post-Op Procedure(s) (LRB): LEFT TOTAL KNEE ARTHROPLASTY (Left) Patient reports pain as moderate.    Patient has complaints of left knee pain  We will start therapy today. Plan is to go home with home health after hospital stay.  Objective: Vital signs in last 24 hours: Temp:  [97.3 F (36.3 C)-99.5 F (37.5 C)] 99.5 F (37.5 C) (04/25 0601) Pulse Rate:  [77-98] 95 (04/25 0601) Resp:  [14-16] 16 (04/25 0601) BP: (127-168)/(63-92) 156/84 mmHg (04/25 0601) SpO2:  [94 %-100 %] 94 % (04/25 0601) Weight:  [103 kg (227 lb 1.2 oz)] 103 kg (227 lb 1.2 oz) (04/24 1600)  Intake/Output from previous day:  Intake/Output Summary (Last 24 hours) at 06/15/12 0748 Last data filed at 06/15/12 0601  Gross per 24 hour  Intake 4593.33 ml  Output   2165 ml  Net 2428.33 ml    Intake/Output this shift:    Labs: Results for orders placed during the hospital encounter of 06/14/12  GLUCOSE, CAPILLARY      Result Value Range   Glucose-Capillary 118 (*) 70 - 99 mg/dL  GLUCOSE, CAPILLARY      Result Value Range   Glucose-Capillary 165 (*) 70 - 99 mg/dL  GLUCOSE, CAPILLARY      Result Value Range   Glucose-Capillary 107 (*) 70 - 99 mg/dL  GLUCOSE, CAPILLARY      Result Value Range   Glucose-Capillary 122 (*) 70 - 99 mg/dL  CBC      Result Value Range   WBC 9.9  4.0 - 10.5 K/uL   RBC 4.13 (*) 4.22 - 5.81 MIL/uL   Hemoglobin 12.8 (*) 13.0 - 17.0 g/dL   HCT 56.2 (*) 13.0 - 86.5 %   MCV 92.3  78.0 - 100.0 fL   MCH 31.0  26.0 - 34.0 pg   MCHC 33.6  30.0 - 36.0 g/dL   RDW 78.4  69.6 - 29.5 %   Platelets 173  150 - 400 K/uL  BASIC METABOLIC PANEL      Result Value Range   Sodium 134 (*) 135 - 145 mEq/L   Potassium 3.8  3.5 - 5.1 mEq/L   Chloride 96  96 - 112 mEq/L   CO2 30  19 - 32 mEq/L   Glucose, Bld 200 (*) 70 - 99 mg/dL   BUN 16  6 - 23 mg/dL   Creatinine, Ser 2.84  0.50 - 1.35 mg/dL   Calcium 8.1 (*) 8.4 - 10.5 mg/dL   GFR calc non Af Amer 54 (*) >90 mL/min   GFR calc Af Amer 63 (*) >90 mL/min  GLUCOSE, CAPILLARY      Result Value Range   Glucose-Capillary 113 (*) 70 - 99 mg/dL  GLUCOSE, CAPILLARY      Result Value Range   Glucose-Capillary 197 (*) 70 - 99 mg/dL    Exam - Neurologically intact ABD soft Neurovascular intact Sensation intact distally Intact pulses distally Dorsiflexion/Plantar flexion intact Incision: dressing C/D/I and no drainage No cellulitis present Compartment soft Dressing - clean, dry, no drainage Motor function intact - moving foot and toes well on exam. No calf pain or sign of DVT Hemovac pulled without difficulty.  Assessment/Plan: 1 Day Post-Op Procedure(s) (LRB): LEFT TOTAL KNEE ARTHROPLASTY (Left)  Advance diet Up with therapy D/C IV fluids Past Medical History  Diagnosis Date  . Diabetes mellitus   . Anginal pain     "  when gets angry the muscle cuts off blood flow in artery"  . Hypertension     "on benicar for kidneys"  . GERD (gastroesophageal reflux disease)   . Arthritis     DVT Prophylaxis - Xarelto Protocol Weight-Bearing as tolerated to left leg Keep foley until tomorrow. No vaccines.  BISSELL, JACLYN M. 06/15/2012, 7:48 AM

## 2012-06-15 NOTE — Progress Notes (Signed)
Physical Therapy Treatment Note   06/15/12 1500  PT Visit Information  Last PT Received On 06/15/12  Assistance Needed +1  PT Time Calculation  PT Start Time 1433  PT Stop Time 1458  PT Time Calculation (min) 25 min  Subjective Data  Subjective I can't go too far.  (ambulate)  Precautions  Precautions Knee  Required Braces or Orthoses Knee Immobilizer - Right  Restrictions  Other Position/Activity Restrictions WBAT  Cognition  Arousal/Alertness Awake/alert  Behavior During Therapy WFL for tasks assessed/performed  Overall Cognitive Status Within Functional Limits for tasks assessed  Bed Mobility  Bed Mobility Sit to Supine  Sit to Supine 4: Min assist  Details for Bed Mobility Assistance verbal cues for technique and assist for L LE  Transfers  Transfers Sit to Stand;Stand to Sit  Sit to Stand 4: Min assist;With upper extremity assist;From chair/3-in-1  Stand to Sit 4: Min assist;With upper extremity assist;To bed  Details for Transfer Assistance verbal cues for hand placement and L LE forward, assist to rise and control descent  Ambulation/Gait  Ambulation/Gait Assistance 4: Min assist  Ambulation Distance (Feet) 18 Feet  Assistive device Rolling walker  Ambulation/Gait Assistance Details verbal cues for sequence, step length, RW distance, WBing through UEs for pain control, pt reports distance limited by fatigue and knee pain  Gait Pattern Step-to pattern  Total Joint Exercises  Ankle Circles/Pumps AROM;Both;Supine;20 reps  Quad Sets AROM;Left;15 reps  Heel Slides AAROM;Left;10 reps;Supine  Hip ABduction/ADduction AROM;Left;10 reps  Gluteal Sets AROM;20 reps;Both  Short Arc [employer];Left;10 reps  PT - End of Session  Equipment Utilized During Treatment Gait belt;Left knee immobilizer  Activity Tolerance Patient limited by fatigue;Patient limited by pain  Patient left with call bell/phone within reach;in bed;with family/visitor present  PT - Assessment/Plan   Comments on Treatment Session Pt ambulated short distance this afternoon then performed a few exercises in supine.  Pt reports fatigue and L knee pain limiting ambulation distance so ice packs applied.  PT Plan Discharge plan remains appropriate;Frequency remains appropriate  Follow Up Recommendations Home health PT  PT equipment Rolling walker with 5" wheels (spouse to check RW vs SW at home)  Acute Rehab PT Goals  PT Goal: Sit to Stand - Progress Progressing toward goal  PT Goal: Ambulate - Progress Progressing toward goal  PT Goal: Perform Home Exercise Program - Progress Progressing toward goal  PT General Charges  $$ ACUTE PT VISIT 1 Procedure  PT Treatments  $Gait Training 8-22 mins  $Therapeutic Exercise 8-22 mins   Zenovia Jarred, PT, DPT 06/15/2012 Pager: [phone]

## 2012-06-15 NOTE — Evaluation (Signed)
Physical Therapy Evaluation Patient Details Name: [name] MRN: [id number] DOB: Jan 04, 1946 Today's Date: 06/15/2012 Time: 2130-8657 PT Time Calculation (min): 28 min  PT Assessment / Plan / Recommendation Clinical Impression  67 y.o. male admitted for L TKA. Pt ambulated 74' with RW and min A. HHPT recommended. Will benefit from acute PT to maximize safety and independence with mobility.     PT Assessment  Patient needs continued PT services    Follow Up Recommendations  Home health PT    Does the patient have the potential to tolerate intense rehabilitation      Barriers to Discharge None      Equipment Recommendations  Rolling walker with 5" wheels (pt has walker at home, unsure if its SW or RW, wife to check)    Recommendations for Other Services OT consult   Frequency 7X/week    Precautions / Restrictions Precautions Precautions: Knee Required Braces or Orthoses: Knee Immobilizer - Right Restrictions Weight Bearing Restrictions: No Other Position/Activity Restrictions: WBAT   Pertinent Vitals/Pain **5-6/10 L knee pain  Premedicated, ice applied after PT*      Mobility  Bed Mobility Bed Mobility: Supine to Sit;Sitting - Scoot to Edge of Bed Supine to Sit: 3: Mod assist Sitting - Scoot to Edge of Bed: 3: Mod assist Details for Bed Mobility Assistance: assist to elevate trunk and support LLE Transfers Transfers: Sit to Stand;Stand to Sit Sit to Stand: 4: Min assist;From bed;From elevated surface;With upper extremity assist Stand to Sit: To chair/3-in-1;With upper extremity assist;4: Min guard Details for Transfer Assistance: VCs for hand placement Ambulation/Gait Ambulation/Gait Assistance: 4: Min assist Ambulation Distance (Feet): 18 Feet Assistive device: Rolling walker Gait Pattern: Step-to pattern General Gait Details: VCs sequencing and min A to manage RW    Exercises Total Joint Exercises Ankle Circles/Pumps: AROM;10 reps;Both;Supine Quad Sets:  AROM;Left;5 reps;Supine Heel Slides: AAROM;Left;10 reps;Supine Goniometric ROM: L knee flexion AAROM 25*   PT Diagnosis: Difficulty walking;Acute pain  PT Problem List: Decreased strength;Decreased range of motion;Decreased activity tolerance;Decreased mobility;Decreased knowledge of use of DME;Pain PT Treatment Interventions: DME instruction;Gait training;Stair training;Functional mobility training;Therapeutic exercise;Therapeutic activities;Patient/family education   PT Goals Acute Rehab PT Goals PT Goal Formulation: With patient/family Time For Goal Achievement: 06/29/12 Potential to Achieve Goals: Good Pt will go Supine/Side to Sit: Independently;with HOB 0 degrees PT Goal: Supine/Side to Sit - Progress: Goal set today Pt will go Sit to Stand: with modified independence;with upper extremity assist PT Goal: Sit to Stand - Progress: Goal set today Pt will Ambulate: 51 - 150 feet;with modified independence;with rolling walker PT Goal: Ambulate - Progress: Goal set today Pt will Go Up / Down Stairs: 3-5 stairs;with min assist;with rail(s) PT Goal: Up/Down Stairs - Progress: Goal set today Pt will Perform Home Exercise Program: with min assist PT Goal: Perform Home Exercise Program - Progress: Goal set today  Visit Information  Last PT Received On: 06/15/12 Assistance Needed: +1    Subjective Data  Subjective: My brother had to have his knee broken up (manipulation), I don't want that to happen to me.  Patient Stated Goal: to walk   Prior Functioning  Home Living Lives With: Spouse Available Help at Discharge: Family;Available 24 hours/day Type of Home: House Home Access: Stairs to enter [employer] of Steps: 3 Entrance Stairs-Rails: Right;Left;Can reach both Home Layout: One level Bathroom Shower/Tub: Engineer, manufacturing systems: Standard Home Adaptive Equipment: Walker - standard Additional Comments: bamboo cane Prior Function Level of Independence:  Independent Able to Take  Stairs?: Yes Vocation: Retired Musician: No difficulties    Copywriter, advertising Arousal/Alertness: Awake/alert Behavior During Therapy: WFL for tasks assessed/performed Overall Cognitive Status: Within Functional Limits for tasks assessed    Extremity/Trunk Assessment Right Upper Extremity Assessment RUE ROM/Strength/Tone: WFL for tasks assessed Left Upper Extremity Assessment LUE ROM/Strength/Tone: WFL for tasks assessed (crepitus noted L wrist with use of RW) Right Lower Extremity Assessment RLE ROM/Strength/Tone: Within functional levels RLE Sensation: WFL - Light Touch RLE Coordination: WFL - gross/fine motor Left Lower Extremity Assessment LLE ROM/Strength/Tone: Deficits LLE ROM/Strength/Tone Deficits: knee flexion AAROM 25*, ankle strength WNL, hip strength +2/5 limited by pain LLE Sensation: WFL - Light Touch LLE Coordination: WFL - gross/fine motor Trunk Assessment Trunk Assessment: Normal   Balance    End of Session PT - End of Session Equipment Utilized During Treatment: Gait belt;Left knee immobilizer Activity Tolerance: Patient limited by fatigue Patient left: in chair;with call bell/phone within reach;with family/visitor present Nurse Communication: Mobility status  GP     Ralene Bathe Kistler 06/15/2012, 1:01 PM [phone]

## 2012-06-16 LAB — CBC
HCT: 38 % — ABNORMAL LOW (ref 39.0–52.0)
Platelets: 193 10*3/uL (ref 150–400)
RBC: 4.14 MIL/uL — ABNORMAL LOW (ref 4.22–5.81)
RDW: 12.7 % (ref 11.5–15.5)
WBC: 11.3 10*3/uL — ABNORMAL HIGH (ref 4.0–10.5)

## 2012-06-16 LAB — GLUCOSE, CAPILLARY
Glucose-Capillary: 186 mg/dL — ABNORMAL HIGH (ref 70–99)
Glucose-Capillary: 183 mg/dL — ABNORMAL HIGH (ref 70–99)
Glucose-Capillary: 178 mg/dL — ABNORMAL HIGH (ref 70–99)
Glucose-Capillary: 155 mg/dL — ABNORMAL HIGH (ref 70–99)

## 2012-06-16 MED ORDER — POLYETHYLENE GLYCOL 3350 17 G PO PACK
17.0000 g | PACK | Freq: Two times a day (BID) | ORAL | Status: DC
  Administered 2012-06-16: 17 g via ORAL

## 2012-06-16 NOTE — Progress Notes (Signed)
06/16/12 1700  PT Visit Information  Last PT Received On 06/16/12  Assistance Needed +1  PT Time Calculation  PT Start Time 1504  PT Stop Time 1524  PT Time Calculation (min) 20 min  Subjective Data  Subjective I don't want to get up  Precautions  Precautions Knee  Required Braces or Orthoses Knee Immobilizer - Left  Knee Immobilizer - Left Discontinue once straight leg raise with < 10 degree lag  Restrictions  Other Position/Activity Restrictions WBAT  Cognition  Arousal/Alertness Awake/alert  Behavior During Therapy WFL for tasks assessed/performed  Overall Cognitive Status Within Functional Limits for tasks assessed  Bed Mobility  Bed Mobility Supine to Sit;Sit to Supine;Sitting - Scoot to Edge of Bed  Supine to Sit 4: Min assist;HOB elevated  Sitting - Scoot to [place] of Bed 4: Min assist  Sit to Supine 4: Min assist;HOB flat  Details for Bed Mobility Assistance verbal cues for technique and assist for L LE  Transfers  Transfers Sit to Stand;Stand to Sit  Sit to Stand 4: Min guard  Stand to Sit 4: Min guard  Details for Transfer Assistance min verbal cues for hand placement/safety  Ambulation/Gait  Ambulation/Gait Assistance 4: Min guard;5: Supervision  Ambulation Distance (Feet) 65 Feet  Assistive device Rolling walker  Ambulation/Gait Assistance Details verbal cues for sequence, step length, RW distance, WBing through UEs for pain control  Gait Pattern Step-to pattern;Step-through pattern  General Gait Details VCs sequencing and min A to manage RW  PT - End of Session  Equipment Utilized During Treatment Gait belt;Left knee immobilizer  Activity Tolerance Patient tolerated treatment well  Patient left with call bell/phone within reach;in bed;with family/visitor present  PT - Assessment/Plan  Comments on Treatment Session continues to progress, needs to practice stairs in am  PT Plan Discharge plan remains appropriate;Frequency remains appropriate  PT Frequency  7X/week  PT equipment Rolling walker with 5" wheels  Acute Rehab PT Goals  Time For Goal Achievement 06/29/12  Potential to Achieve Goals Good  Pt will go Supine/Side to Sit Independently;with HOB 0 degrees  PT Goal: Supine/Side to Sit - Progress Progressing toward goal  Pt will go Sit to Stand with modified independence;with upper extremity assist  PT Goal: Sit to Stand - Progress Progressing toward goal  Pt will Ambulate 51 - 150 feet;with modified independence;with rolling walker  PT Goal: Ambulate - Progress Progressing toward goal  PT General Charges  $$ ACUTE PT VISIT 1 Procedure  PT Treatments  $Gait Training 8-22 mins

## 2012-06-16 NOTE — Progress Notes (Signed)
Subjective: 2 Days Post-Op Procedure(s) (LRB): LEFT TOTAL KNEE ARTHROPLASTY (Left) Patient reports pain as 4 on 0-10 scale.Dressing changed and wound is fine   Objective: Vital signs in last 24 hours: Temp:  [99.1 F (37.3 C)-101.7 F (38.7 C)] 99.4 F (37.4 C) (04/26 0604) Pulse Rate:  [99-116] 102 (04/26 0604) Resp:  [16] 16 (04/26 0800) BP: (155-177)/(75-81) 155/75 mmHg (04/26 0604) SpO2:  [90 %-96 %] 94 % (04/26 0800)  Intake/Output from previous day: 04/25 0701 - 04/26 0700 In: 360 [P.O.:360] Out: 1875 [Urine:1875] Intake/Output this shift:     Recent Labs  06/15/12 0500 06/16/12 0420  HGB 12.8* 12.8*    Recent Labs  06/15/12 0500 06/16/12 0420  WBC 9.9 11.3*  RBC 4.13* 4.14*  HCT 38.1* 38.0*  PLT 173 193    Recent Labs  06/15/12 0500  NA 134*  K 3.8  CL 96  CO2 30  BUN 16  CREATININE 1.31  GLUCOSE 200*  CALCIUM 8.1*   No results found for this basename: LABPT, INR,  in the last 72 hours  Neurologically intact  Assessment/Plan: 2 Days Post-Op Procedure(s) (LRB): LEFT TOTAL KNEE ARTHROPLASTY (Left) Up with therapy  Loys Hoselton A 06/16/2012, 9:53 AM

## 2012-06-16 NOTE — Evaluation (Signed)
Occupational Therapy Evaluation Patient Details Name: [name] MRN: [id number] DOB: October 19, 1945 Today's Date: 06/16/2012 Time: 2130-8657 OT Time Calculation (min): 21 min  OT Assessment / Plan / Recommendation Clinical Impression  Pt is s/p R TKA and displays some weakness and decreased independence with ADL. Will benefit from acute OT to maximize independence with these tasks.     OT Assessment  Patient needs continued OT Services    Follow Up Recommendations  No OT follow up;Supervision/Assistance - 24 hour    Barriers to Discharge      Equipment Recommendations  3 in 1 bedside comode (pt's wife reports he will need RW)    Recommendations for Other Services    Frequency  Min 2X/week    Precautions / Restrictions Precautions Precautions: Knee Required Braces or Orthoses: Knee Immobilizer - Right Restrictions Other Position/Activity Restrictions: WBAT        ADL  Eating/Feeding: Simulated;Independent Where Assessed - Eating/Feeding: Bed level Grooming: Simulated;Wash/dry hands;Set up Where Assessed - Grooming: Supported sitting Upper Body Bathing: Simulated;Chest;Right arm;Left arm;Abdomen Where Assessed - Upper Body Bathing: Unsupported sitting Lower Body Bathing: Simulated;Moderate assistance Where Assessed - Lower Body Bathing: Supported sit to stand Upper Body Dressing: Simulated;Set up Where Assessed - Upper Body Dressing: Unsupported sitting Lower Body Dressing: Simulated;Moderate assistance Where Assessed - Lower Body Dressing: Supported sit to Pharmacist, hospital: Performed;Minimal Dentist Method: Surveyor, minerals: Materials engineer and Hygiene: Simulated;Minimal assistance Where Assessed - Engineer, mining and Hygiene: Sit to stand from 3-in-1 or toilet Equipment Used: Rolling walker ADL Comments: Pt's wife states he will need a RW and 3in1 for home. He needed min  verbal cues for hand placement and RW safety with functional transfers. He stood at [place] and held [employer] wife assisted with pulling up underwear. Wife and pt educated on safety with ADL and how wife can safely assist. Educated on KI wear and how to don/doff. Pt plans to sponge bathe initially as he is not currently interested in tub DME.     OT Diagnosis: Generalized weakness  OT Problem List: Decreased strength;Decreased knowledge of use of DME or AE OT Treatment Interventions: Self-care/ADL training;DME and/or AE instruction;Therapeutic activities   OT Goals Acute Rehab OT Goals OT Goal Formulation: With patient/family Time For Goal Achievement: 06/23/12 Potential to Achieve Goals: Good ADL Goals Pt Will Perform Grooming: with supervision;Standing at sink ADL Goal: Grooming - Progress: Goal set today Pt Will Transfer to Toilet: with supervision;Ambulation;3-in-1;with DME ADL Goal: Toilet Transfer - Progress: Goal set today Pt Will Perform Toileting - Clothing Manipulation: with supervision;Standing ADL Goal: Toileting - Clothing Manipulation - Progress: Goal set today  Visit Information  Last OT Received On: 06/16/12 Assistance Needed: +1    Subjective Data  Subjective: I just finished washing up Patient Stated Goal: home tomorrow   Prior Functioning     Home Living Lives With: Spouse Available Help at Discharge: Family;Available 24 hours/day Type of Home: House Home Access: Stairs to enter [employer] of Steps: 3 Entrance Stairs-Rails: Right;Left;Can reach both Home Layout: One level Bathroom Shower/Tub: Engineer, manufacturing systems: Standard Home Adaptive Equipment: Environmental consultant - standard Additional Comments: bamboo cane Prior Function Level of Independence: Independent Able to Take Stairs?: Yes Vocation: Retired Musician: No difficulties         Vision/Perception     Copywriter, advertising Arousal/Alertness:  Awake/alert Behavior During Therapy: WFL for tasks assessed/performed Overall Cognitive Status: Within Functional Limits for tasks  assessed    Extremity/Trunk Assessment Right Upper Extremity Assessment RUE ROM/Strength/Tone: [facility] for tasks assessed Left Upper Extremity Assessment LUE ROM/Strength/Tone: WFL for tasks assessed     Mobility Bed Mobility Bed Mobility: Supine to Sit Supine to Sit: 4: Min assist;HOB elevated Transfers Transfers: Sit to Stand;Stand to Sit Sit to Stand: 4: Min assist;With upper extremity assist;From bed;From chair/3-in-1 Stand to Sit: 4: Min assist;With upper extremity assist;To chair/3-in-1 Details for Transfer Assistance: min verbal cues for hand placement/safety     Exercise     Balance Balance Balance Assessed: Yes Static Standing Balance Static Standing - Level of Assistance: 4: Min assist   End of Session OT - End of Session Activity Tolerance: Patient tolerated treatment well Patient left: in chair;with call bell/phone within reach;with family/visitor present  GO     Lennox Laity [phone] 06/16/2012, 10:13 AM

## 2012-06-16 NOTE — Progress Notes (Signed)
Physical Therapy Treatment Patient Details Name: [name] MRN: [id number] DOB: Nov 27, 1945 Today's Date: 06/16/2012 Time: 1215-1242 PT Time Calculation (min): 27 min  PT Assessment / Plan / Recommendation Comments on Treatment Session  pt progressing, pain better today; wants to get home     Follow Up Recommendations  Home health PT     Does the patient have the potential to tolerate intense rehabilitation     Barriers to Discharge        Equipment Recommendations  Rolling walker with 5" wheels    Recommendations for Other Services    Frequency 7X/week   Plan Discharge plan remains appropriate;Frequency remains appropriate    Precautions / Restrictions Precautions Precautions: Knee Required Braces or Orthoses: Knee Immobilizer - Left Knee Immobilizer - Left: Discontinue once straight leg raise with < 10 degree lag Restrictions Other Position/Activity Restrictions: WBAT   Pertinent Vitals/Pain Premedicated; pain tolerable per pt     Mobility  Bed Mobility Bed Mobility: Sit to Supine Sit to Supine: 4: Min assist Details for Bed Mobility Assistance: verbal cues for technique and assist for L LE Transfers Transfers: Sit to Stand;Stand to Sit Sit to Stand: 4: Min guard Stand to Sit: 4: Min guard Details for Transfer Assistance: min verbal cues for hand placement/safety Ambulation/Gait Ambulation/Gait Assistance: 4: Min guard;5: Supervision Ambulation Distance (Feet): 50 Feet Assistive device: Rolling walker Ambulation/Gait Assistance Details: verbal cues for sequence, step length, RW distance, WBing through UEs for pain control Gait Pattern: Step-to pattern;Step-through pattern    Exercises Total Joint Exercises Ankle Circles/Pumps: AROM;Both;Supine;20 reps Quad Sets: AROM;Left;15 reps Short Arc QuadBarbaraann [name];Left;10 reps Heel Slides: AAROM;Left;10 reps;Supine Hip ABduction/ADduction: AROM;Left;10 reps Goniometric ROM: ~40 degreees   PT Diagnosis:    PT  Problem List:   PT Treatment Interventions:     PT Goals Acute Rehab PT Goals Time For Goal Achievement: 06/29/12 Potential to Achieve Goals: Good Pt will go Sit to Stand: with modified independence;with upper extremity assist PT Goal: Sit to Stand - Progress: Progressing toward goal Pt will Ambulate: 51 - 150 feet;with modified independence;with rolling walker PT Goal: Ambulate - Progress: Progressing toward goal  Visit Information  Last PT Received On: 06/16/12 Assistance Needed: +1    Subjective Data  Subjective: Are you coming back for more pain? Patient Stated Goal: back to hunting   Cognition  Cognition Arousal/Alertness: Awake/alert Behavior During Therapy: WFL for tasks assessed/performed Overall Cognitive Status: Within Functional Limits for tasks assessed    Balance     End of Session PT - End of Session Equipment Utilized During Treatment: Gait belt;Left knee immobilizer Activity Tolerance: Patient tolerated treatment well Patient left: with call bell/phone within reach;in bed;with family/visitor present   GP     [facility] 06/16/2012, 2:37 PM

## 2012-06-17 LAB — CBC
Hemoglobin: 11.9 g/dL — ABNORMAL LOW (ref 13.0–17.0)
MCH: 30.8 pg (ref 26.0–34.0)
RBC: 3.86 MIL/uL — ABNORMAL LOW (ref 4.22–5.81)

## 2012-06-17 LAB — GLUCOSE, CAPILLARY
Glucose-Capillary: 68 mg/dL — ABNORMAL LOW (ref 70–99)
Glucose-Capillary: 101 mg/dL — ABNORMAL HIGH (ref 70–99)

## 2012-06-17 NOTE — Progress Notes (Signed)
Physical Therapy Treatment Patient Details Name: [name] MRN: [id number] DOB: 1946/02/08 Today's Date: 06/17/2012 Time: 4098-1191 PT Time Calculation (min): 48 min  PT Assessment / Plan / Recommendation Comments on Treatment Session  pt and wife feel ready to D/C; knee pain controlled; ice to knee after PT    Follow Up Recommendations  Home health PT;Supervision/Assistance - 24 hour     Does the patient have the potential to tolerate intense rehabilitation     Barriers to Discharge        Equipment Recommendations  Rolling walker with 5" wheels    Recommendations for Other Services    Frequency 7X/week   Plan Discharge plan remains appropriate;Frequency remains appropriate    Precautions / Restrictions Precautions Precautions: Knee Required Braces or Orthoses: Knee Immobilizer - Left Knee Immobilizer - Left: Discontinue once straight leg raise with < 10 degree lag Restrictions Weight Bearing Restrictions: No Other Position/Activity Restrictions: WBAT   Pertinent Vitals/Pain      Mobility  Bed Mobility Bed Mobility: Supine to Sit Supine to Sit: 4: Min assist Sitting - Scoot to Edge of Bed: 5: Supervision Details for Bed Mobility Assistance: verbal cues for technique and assist for L LE Transfers Transfers: Sit to Stand;Stand to Sit Sit to Stand: 4: Min guard Stand to Sit: 4: Min guard Details for Transfer Assistance: min verbal cues for hand placement/safety Ambulation/Gait Ambulation/Gait Assistance: 4: Min guard;5: Supervision Ambulation Distance (Feet): 100 Feet Assistive device: Rolling walker Ambulation/Gait Assistance Details: verbal cues for sequence, step length, RW distance, WBing through UEs for pain control Gait Pattern: Step-to pattern;Step-through pattern Stairs: Yes Stairs Assistance: 4: Min guard Stair Management Technique: Two rails;Forwards Number of Stairs: 3    Exercises Total Joint Exercises Ankle Circles/Pumps: AROM;Both;Supine;20  reps Quad Sets: AROM;Left;15 reps Heel Slides: AAROM;Left;10 reps;Supine Straight Leg Raises: AAROM;Left;10 reps Goniometric ROM: 15-58 degrees flexion AAROM   PT Diagnosis:    PT Problem List:   PT Treatment Interventions:     PT Goals Acute Rehab PT Goals Time For Goal Achievement: 06/29/12 Potential to Achieve Goals: Good Pt will go Supine/Side to Sit: Independently;with HOB 0 degrees PT Goal: Supine/Side to Sit - Progress: Progressing toward goal Pt will go Sit to Stand: with modified independence;with upper extremity assist PT Goal: Sit to Stand - Progress: Progressing toward goal Pt will Ambulate: 51 - 150 feet;with modified independence;with rolling walker PT Goal: Ambulate - Progress: Progressing toward goal Pt will Go Up / Down Stairs: 3-5 stairs;with min assist;with rail(s) PT Goal: Up/Down Stairs - Progress: Met Pt will Perform Home Exercise Program: with min assist PT Goal: Perform Home Exercise Program - Progress: Met  Visit Information  Last PT Received On: 06/17/12 Assistance Needed: +1    Subjective Data  Subjective: i should have gotten up   Cognition  Cognition Arousal/Alertness: Awake/alert Behavior During Therapy: WFL for tasks assessed/performed Overall Cognitive Status: Within Functional Limits for tasks assessed    Balance  Balance Balance Assessed: Yes Static Standing Balance Static Standing - Balance Support: No upper extremity supported;During functional activity Static Standing - Level of Assistance: 5: Stand by assistance  End of Session PT - End of Session Equipment Utilized During Treatment: Gait belt;Left knee immobilizer Activity Tolerance: Patient tolerated treatment well Patient left: with call bell/phone within reach;in bed;with family/visitor present   GP     [facility] 06/17/2012, 9:58 AM

## 2012-06-17 NOTE — Progress Notes (Signed)
   Subjective: 3 Days Post-Op Procedure(s) (LRB): LEFT TOTAL KNEE ARTHROPLASTY (Left)  Pt doing well  Ready for d/c home  Patient reports pain as none.  Objective:   VITALS:   Filed Vitals:   06/17/12 0511  BP: 150/74  Pulse: 94  Temp: 98.8 F (37.1 C)  Resp: 18   Left knee incision healing well nv intact distally No erythema or drainage Neurologically intact  LABS  Recent Labs  06/15/12 0500 06/16/12 0420 06/17/12 0506  HGB 12.8* 12.8* 11.9*  HCT 38.1* 38.0* 35.5*  WBC 9.9 11.3* 10.3  PLT 173 193 199     Recent Labs  06/15/12 0500  NA 134*  K 3.8  BUN 16  CREATININE 1.31  GLUCOSE 200*     Assessment/Plan: 3 Days Post-Op Procedure(s) (LRB): LEFT TOTAL KNEE ARTHROPLASTY (Left)  Pt doing well Discharge home with home health   [name] Overall, MPAS, PA-C  06/17/2012, 7:57 AM

## 2012-06-17 NOTE — Discharge Summary (Signed)
Physician Discharge Summary   Patient ID: [name] MRN: [id number] DOB/AGE: July 23, 1945 67 y.o.  Admit date: 06/14/2012 Discharge date: 06/17/2012  Admission Diagnoses:  Principal Problem:   Left knee DJD   Discharge Diagnoses:  Same   Surgeries: Procedure(s): LEFT TOTAL KNEE ARTHROPLASTY on 06/14/2012   Consultants: PT/OT  Discharged Condition: Stable  Hospital Course: [name] is an 67 y.o. male who was admitted 06/14/2012 with a chief complaint of No chief complaint on file. , and found to have a diagnosis of Left knee DJD.  They were brought to the operating room on 06/14/2012 and underwent the above named procedures.    The patient had an uncomplicated hospital course and was stable for discharge.  Recent vital signs:  Filed Vitals:   06/17/12 0511  BP: 150/74  Pulse: 94  Temp: 98.8 F (37.1 C)  Resp: 18    Recent laboratory studies:  Results for orders placed during the hospital encounter of 06/14/12  GLUCOSE, CAPILLARY      Result Value Range   Glucose-Capillary 118 (*) 70 - 99 mg/dL  GLUCOSE, CAPILLARY      Result Value Range   Glucose-Capillary 165 (*) 70 - 99 mg/dL  GLUCOSE, CAPILLARY      Result Value Range   Glucose-Capillary 107 (*) 70 - 99 mg/dL  GLUCOSE, CAPILLARY      Result Value Range   Glucose-Capillary 122 (*) 70 - 99 mg/dL  CBC      Result Value Range   WBC 9.9  4.0 - 10.5 K/uL   RBC 4.13 (*) 4.22 - 5.81 MIL/uL   Hemoglobin 12.8 (*) 13.0 - 17.0 g/dL   HCT 40.9 (*) 81.1 - 91.4 %   MCV 92.3  78.0 - 100.0 fL   MCH 31.0  26.0 - 34.0 pg   MCHC 33.6  30.0 - 36.0 g/dL   RDW 78.2  95.6 - 21.3 %   Platelets 173  150 - 400 K/uL  BASIC METABOLIC PANEL      Result Value Range   Sodium 134 (*) 135 - 145 mEq/L   Potassium 3.8  3.5 - 5.1 mEq/L   Chloride 96  96 - 112 mEq/L   CO2 30  19 - 32 mEq/L   Glucose, Bld 200 (*) 70 - 99 mg/dL   BUN 16  6 - 23 mg/dL   Creatinine, Ser 0.86  0.50 - 1.35 mg/dL   Calcium 8.1 (*) 8.4 - 10.5 mg/dL   GFR calc non Af Amer 54 (*) >90 mL/min   GFR calc Af Amer 63 (*) >90 mL/min  GLUCOSE, CAPILLARY      Result Value Range   Glucose-Capillary 113 (*) 70 - 99 mg/dL  GLUCOSE, CAPILLARY      Result Value Range   Glucose-Capillary 197 (*) 70 - 99 mg/dL  GLUCOSE, CAPILLARY      Result Value Range   Glucose-Capillary 245 (*) 70 - 99 mg/dL  GLUCOSE, CAPILLARY      Result Value Range   Glucose-Capillary 219 (*) 70 - 99 mg/dL  CBC      Result Value Range   WBC 11.3 (*) 4.0 - 10.5 K/uL   RBC 4.14 (*) 4.22 - 5.81 MIL/uL   Hemoglobin 12.8 (*) 13.0 - 17.0 g/dL   HCT 57.8 (*) 46.9 - 62.9 %   MCV 91.8  78.0 - 100.0 fL   MCH 30.9  26.0 - 34.0 pg   MCHC 33.7  30.0 - 36.0 g/dL  RDW 12.7  11.5 - 15.5 %   Platelets 193  150 - 400 K/uL  GLUCOSE, CAPILLARY      Result Value Range   Glucose-Capillary 226 (*) 70 - 99 mg/dL   Comment 1 Notify RN    GLUCOSE, CAPILLARY      Result Value Range   Glucose-Capillary 186 (*) 70 - 99 mg/dL  GLUCOSE, CAPILLARY      Result Value Range   Glucose-Capillary 183 (*) 70 - 99 mg/dL  GLUCOSE, CAPILLARY      Result Value Range   Glucose-Capillary 178 (*) 70 - 99 mg/dL  CBC      Result Value Range   WBC 10.3  4.0 - 10.5 K/uL   RBC 3.86 (*) 4.22 - 5.81 MIL/uL   Hemoglobin 11.9 (*) 13.0 - 17.0 g/dL   HCT 81.1 (*) 91.4 - 78.2 %   MCV 92.0  78.0 - 100.0 fL   MCH 30.8  26.0 - 34.0 pg   MCHC 33.5  30.0 - 36.0 g/dL   RDW 95.6  21.3 - 08.6 %   Platelets 199  150 - 400 K/uL  GLUCOSE, CAPILLARY      Result Value Range   Glucose-Capillary 155 (*) 70 - 99 mg/dL  GLUCOSE, CAPILLARY      Result Value Range   Glucose-Capillary 68 (*) 70 - 99 mg/dL  GLUCOSE, CAPILLARY      Result Value Range   Glucose-Capillary 101 (*) 70 - 99 mg/dL    Discharge Medications:     Medication List    STOP taking these medications       aspirin 325 MG tablet     CENTRUM SILVER ADULT 50+ PO     ibuprofen 200 MG tablet  Commonly known as:  ADVIL,MOTRIN      TAKE these  medications       glimepiride 4 MG tablet  Commonly known as:  AMARYL  Take 2 mg by mouth 2 (two) times daily.     insulin glargine 100 UNIT/ML injection  Commonly known as:  LANTUS  Inject 100 Units into the skin daily.     methocarbamol 500 MG tablet  Commonly known as:  ROBAXIN  Take 1 tablet (500 mg total) by mouth 4 (four) times daily.     olmesartan 40 MG tablet  Commonly known as:  BENICAR  Take 40 mg by mouth every morning.     oxyCODONE-acetaminophen 5-325 MG per tablet  Commonly known as:  ROXICET  Take 1-2 tablets by mouth every 4 (four) hours as needed for pain.     psyllium 58.6 % packet  Commonly known as:  METAMUCIL  Take 1 packet by mouth daily.     rivaroxaban 10 MG Tabs tablet  Commonly known as:  XARELTO  Take 1 tablet (10 mg total) by mouth daily.        Diagnostic Studies: Dg Chest 2 View  06/04/2012  *RADIOLOGY REPORT*  Clinical Data: Preop knee surgery  CHEST - 2 VIEW  Comparison: None  Findings: Heart size and pulmonary vascularity are normal. Negative for heart failure.  Negative for infiltrate or effusion. No mass lesion is present.  Thoracic degenerative spurring is present.  IMPRESSION: No active cardiopulmonary abnormality.   Original Report Authenticated By: Janeece Riggers, M.D.    Dg Knee 1-2 Views Left  06/14/2012  *RADIOLOGY REPORT*  Clinical Data: Postoperative total knee replacement  LEFT KNEE - 1-2 VIEW  Comparison: June 04, 2012  Findings:  Frontal  and lateral views were obtained.  The patient is status post total knee replacement with femoral and tibial components appearing well seated.  No fracture or dislocation.  Air within the joint is expected postoperative finding.  There is a drain in the lateral aspect of the joint.  IMPRESSION: Prosthetic components appear well seated.  No fracture or dislocation.   Original Report Authenticated By: Bretta Bang, M.D.    Dg Knee 1-2 Views Left  06/04/2012  *RADIOLOGY REPORT*  Clinical Data:  Preop knee replacement  LEFT KNEE - 1-2 VIEW  Comparison: None  Findings: Moderate to advanced osteoarthritis with joint space narrowing and spurring most prominent in the medial compartment and the patellofemoral compartment.  There is a moderately large joint effusion.  No fracture.  There is popliteal artery calcification.  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  Negative for fracture.   Original Report Authenticated By: Janeece Riggers, M.D.     Disposition: 01-Home or Self Care      Discharge Orders   Future Orders Complete By Expires     Call MD / Call 911  As directed     Comments:      If you experience chest pain or shortness of breath, CALL 911 and be transported to the hospital emergency room.  If you develope a fever above 101 F, pus (white drainage) or increased drainage or redness at the wound, or calf pain, call your surgeon's office.    Constipation Prevention  As directed     Comments:      Drink plenty of fluids.  Prune juice may be helpful.  You may use a stool softener, such as Colace (over the counter) 100 mg twice a day.  Use MiraLax (over the counter) for constipation as needed.    Diet - low sodium heart healthy  As directed     Increase activity slowly as tolerated  As directed        Follow-up Information   Follow up with BEANE,JEFFREY C, MD In 2 weeks.   Contact information:   [address] [place] [address] [place] [address] [phone]        Signed: Thea Gist 06/17/2012, 8:41 PM

## 2012-07-04 ENCOUNTER — Ambulatory Visit: Payer: Medicare Other | Attending: Specialist | Admitting: Physical Therapy

## 2012-07-04 DIAGNOSIS — R5381 Other malaise: Secondary | ICD-10-CM | POA: Insufficient documentation

## 2012-07-04 DIAGNOSIS — IMO0001 Reserved for inherently not codable concepts without codable children: Secondary | ICD-10-CM | POA: Insufficient documentation

## 2012-07-04 DIAGNOSIS — Z96659 Presence of unspecified artificial knee joint: Secondary | ICD-10-CM | POA: Insufficient documentation

## 2012-07-04 DIAGNOSIS — M25569 Pain in unspecified knee: Secondary | ICD-10-CM | POA: Insufficient documentation

## 2012-07-04 DIAGNOSIS — M130 Polyarthritis, unspecified: Secondary | ICD-10-CM | POA: Insufficient documentation

## 2012-07-05 ENCOUNTER — Ambulatory Visit: Payer: Medicare Other | Admitting: Physical Therapy

## 2012-07-09 ENCOUNTER — Ambulatory Visit: Payer: Medicare Other | Admitting: Physical Therapy

## 2012-07-11 ENCOUNTER — Ambulatory Visit: Payer: Medicare Other | Admitting: Physical Therapy

## 2012-07-13 ENCOUNTER — Ambulatory Visit: Payer: Medicare Other | Admitting: Physical Therapy

## 2012-07-17 ENCOUNTER — Ambulatory Visit: Payer: Medicare Other | Admitting: Physical Therapy

## 2012-07-20 ENCOUNTER — Ambulatory Visit: Payer: Medicare Other | Admitting: Physical Therapy

## 2012-07-23 ENCOUNTER — Ambulatory Visit: Payer: Medicare Other | Attending: Specialist | Admitting: Physical Therapy

## 2012-07-23 DIAGNOSIS — IMO0001 Reserved for inherently not codable concepts without codable children: Secondary | ICD-10-CM | POA: Insufficient documentation

## 2012-07-23 DIAGNOSIS — M25569 Pain in unspecified knee: Secondary | ICD-10-CM | POA: Insufficient documentation

## 2012-07-23 DIAGNOSIS — R5381 Other malaise: Secondary | ICD-10-CM | POA: Insufficient documentation

## 2012-07-23 DIAGNOSIS — M130 Polyarthritis, unspecified: Secondary | ICD-10-CM | POA: Insufficient documentation

## 2012-07-23 DIAGNOSIS — Z96659 Presence of unspecified artificial knee joint: Secondary | ICD-10-CM | POA: Insufficient documentation

## 2012-07-25 ENCOUNTER — Ambulatory Visit: Payer: Medicare Other | Admitting: Physical Therapy

## 2012-07-27 ENCOUNTER — Ambulatory Visit: Payer: Medicare Other | Admitting: Physical Therapy

## 2013-05-20 ENCOUNTER — Ambulatory Visit: Payer: Medicare Other | Admitting: Cardiovascular Disease

## 2013-06-06 ENCOUNTER — Ambulatory Visit: Payer: Medicare Other | Admitting: Cardiovascular Disease

## 2013-07-26 ENCOUNTER — Other Ambulatory Visit (HOSPITAL_COMMUNITY): Payer: Self-pay | Admitting: Specialist

## 2013-07-26 DIAGNOSIS — M25562 Pain in left knee: Secondary | ICD-10-CM

## 2013-08-14 ENCOUNTER — Encounter (HOSPITAL_COMMUNITY)
Admission: RE | Admit: 2013-08-14 | Discharge: 2013-08-14 | Disposition: A | Discharge status: Home / Self Care | Payer: Medicare HMO | Source: Ambulatory Visit | Attending: Diagnostic Radiology | Admitting: Diagnostic Radiology

## 2013-08-14 ENCOUNTER — Encounter (HOSPITAL_COMMUNITY)
Admission: RE | Admit: 2013-08-14 | Discharge: 2013-08-14 | Disposition: A | Discharge status: Home / Self Care | Payer: Medicare HMO | Source: Ambulatory Visit | Attending: Specialist | Admitting: Specialist

## 2013-08-14 DIAGNOSIS — Y929 Unspecified place or not applicable: Secondary | ICD-10-CM | POA: Diagnosis not present

## 2013-08-14 DIAGNOSIS — Y831 Surgical operation with implant of artificial internal device as the cause of abnormal reaction of the patient, or of later complication, without mention of misadventure at the time of the procedure: Secondary | ICD-10-CM | POA: Diagnosis not present

## 2013-08-14 DIAGNOSIS — Z96659 Presence of unspecified artificial knee joint: Secondary | ICD-10-CM | POA: Diagnosis not present

## 2013-08-14 DIAGNOSIS — M1710 Unilateral primary osteoarthritis, unspecified knee: Secondary | ICD-10-CM | POA: Insufficient documentation

## 2013-08-14 DIAGNOSIS — T8489XA Other specified complication of internal orthopedic prosthetic devices, implants and grafts, initial encounter: Secondary | ICD-10-CM | POA: Diagnosis not present

## 2013-08-14 DIAGNOSIS — M25569 Pain in unspecified knee: Secondary | ICD-10-CM | POA: Insufficient documentation

## 2013-08-14 DIAGNOSIS — M25562 Pain in left knee: Secondary | ICD-10-CM

## 2013-08-14 MED ORDER — TECHNETIUM TC 99M MEDRONATE IV KIT
25.0000 | PACK | Freq: Once | INTRAVENOUS | Status: AC | PRN
  Administered 2013-08-14: 25 via INTRAVENOUS

## 2014-02-19 IMAGING — CR DG CHEST 2V
2 series · 2 of 2 positions shown · non-contrast
Comparison: None

CLINICAL DATA: Preop knee surgery

CHEST - 2 VIEW

[w chest pa]
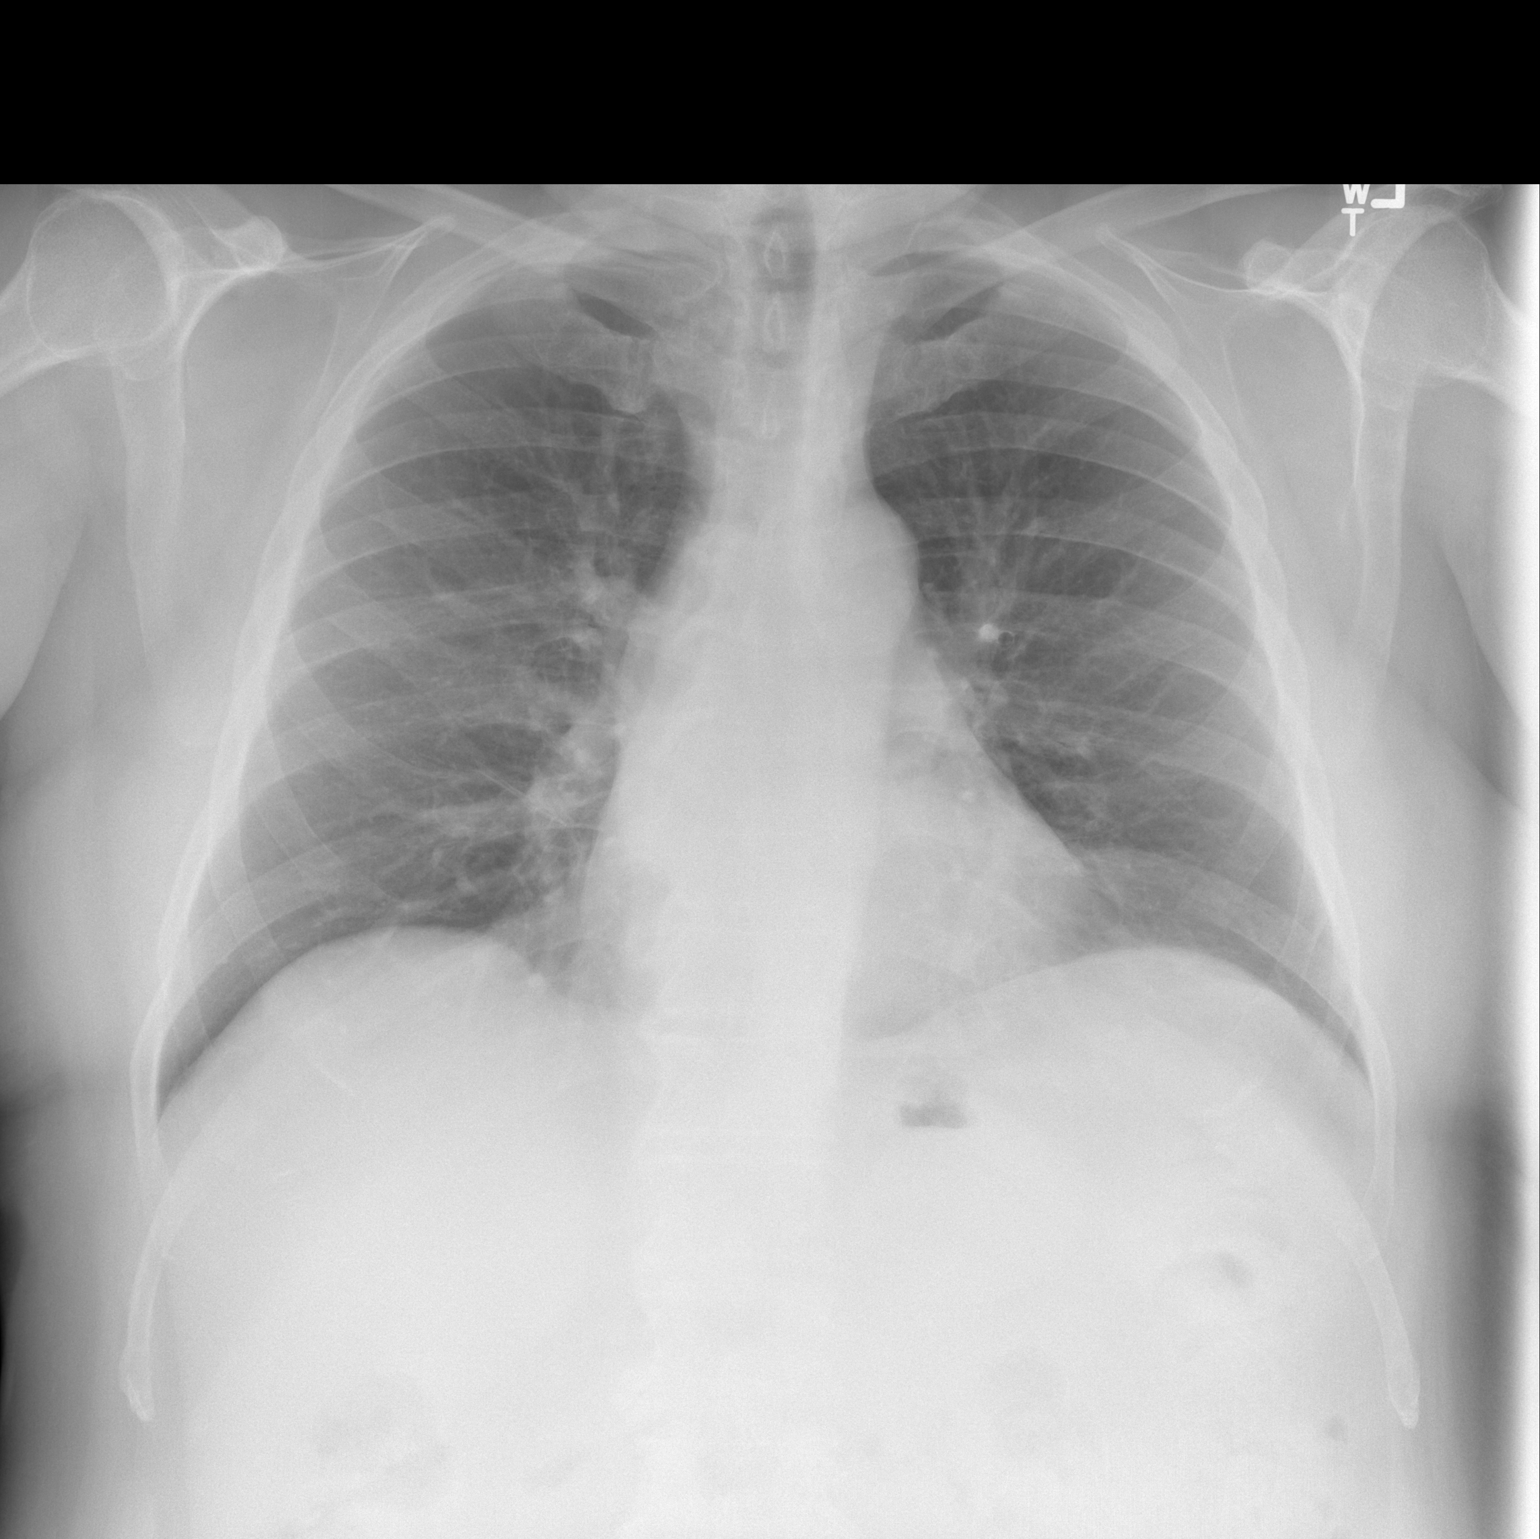

[w chest lat]
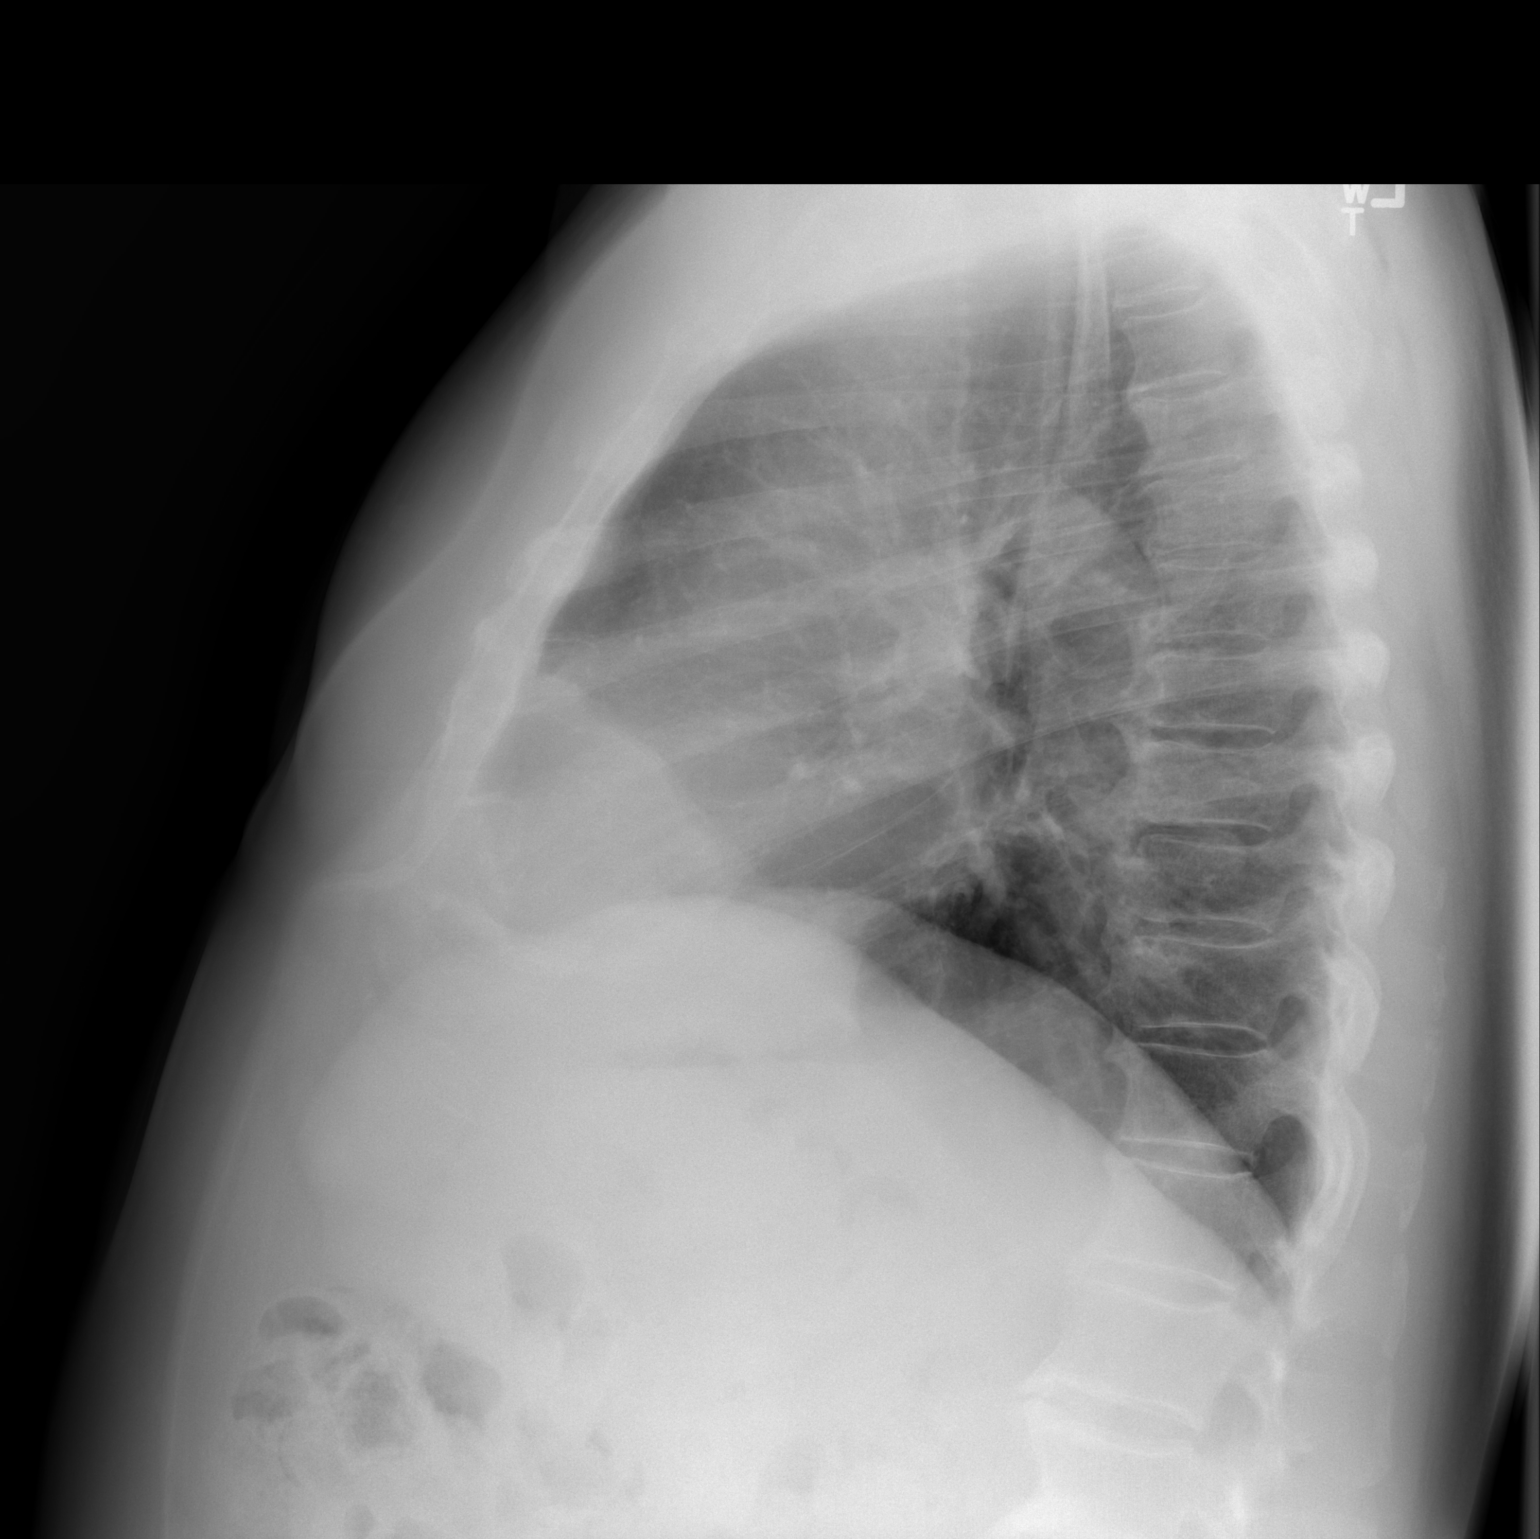

[2 of 2 positions shown; findings below may reference images not displayed]

FINDINGS: Heart size and pulmonary vascularity are normal.
Negative for heart failure.  Negative for infiltrate or effusion.
No mass lesion is present.  Thoracic degenerative spurring is
present.
IMPRESSION: No active cardiopulmonary abnormality.

## 2014-02-19 IMAGING — CR DG KNEE 1-2V*L*
2 series · 2 of 2 positions shown · non-contrast
Comparison: None

CLINICAL DATA: Preop knee replacement

LEFT KNEE - 1-2 VIEW

[t knee ap left]
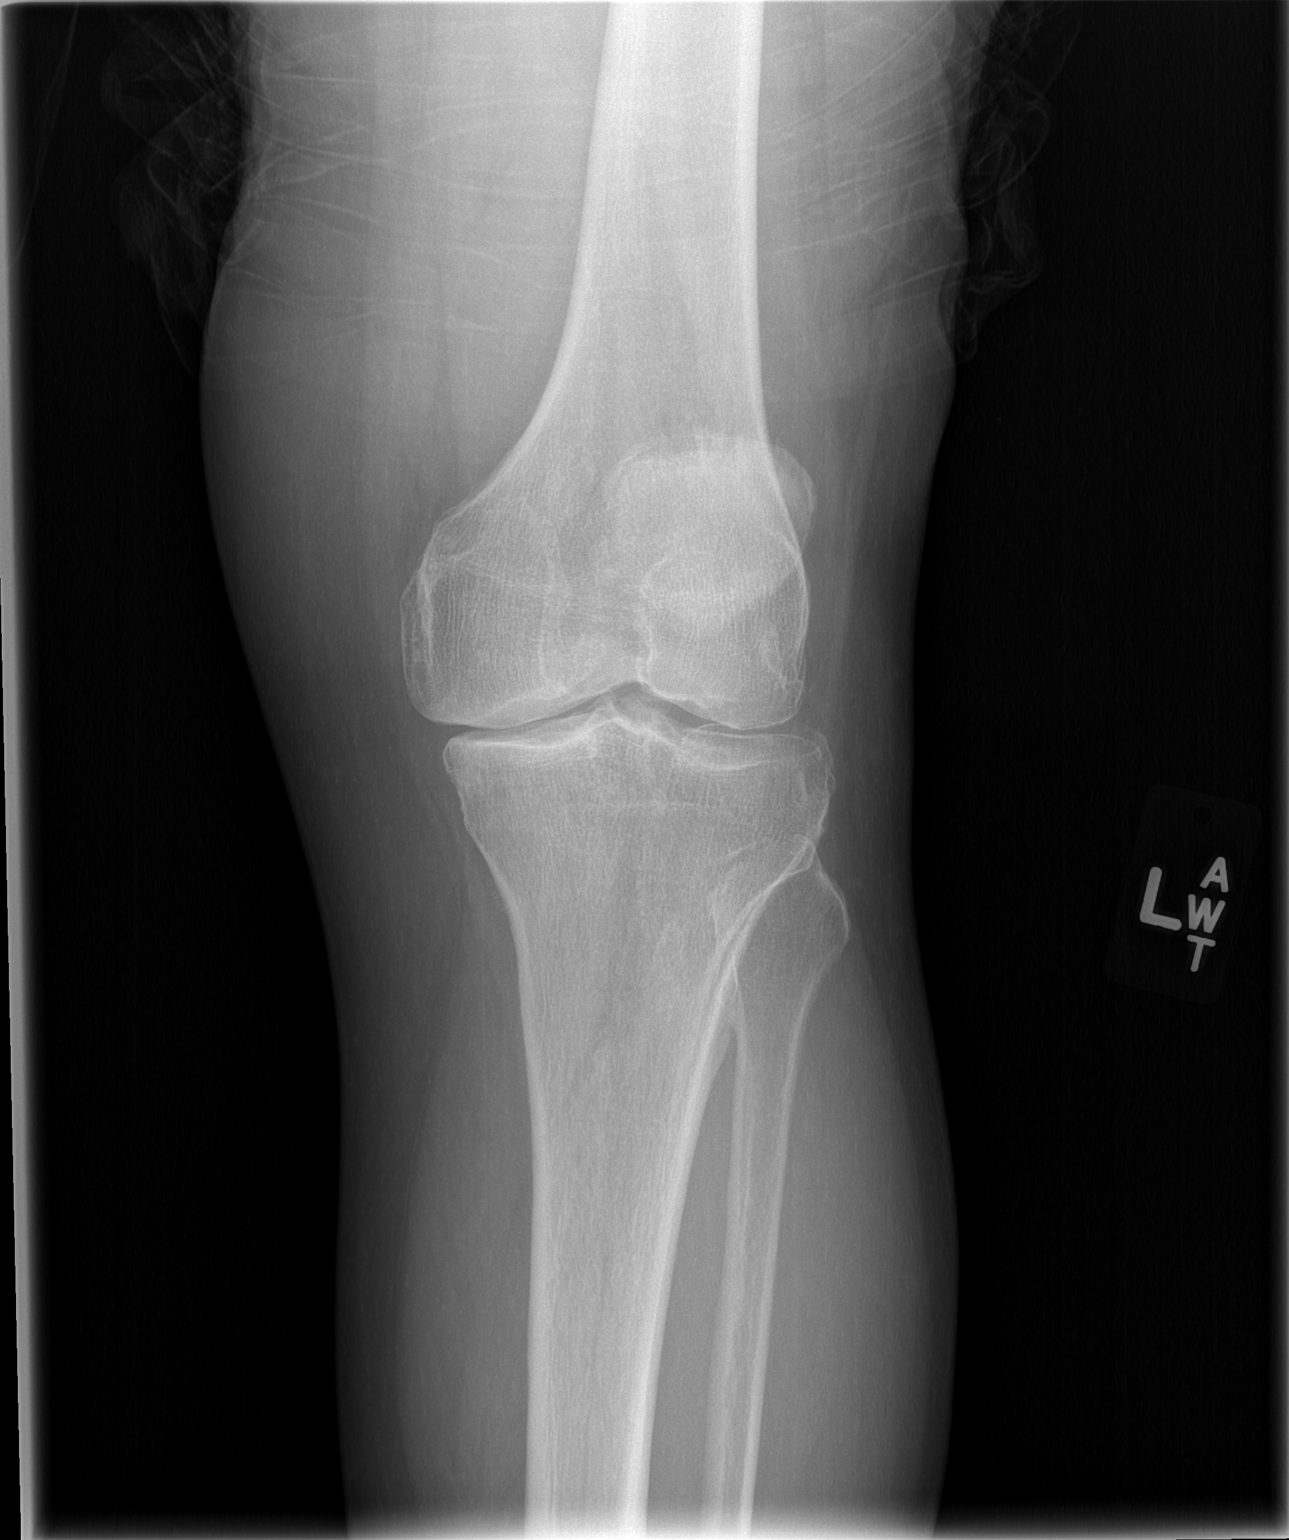

[t knee lat left]
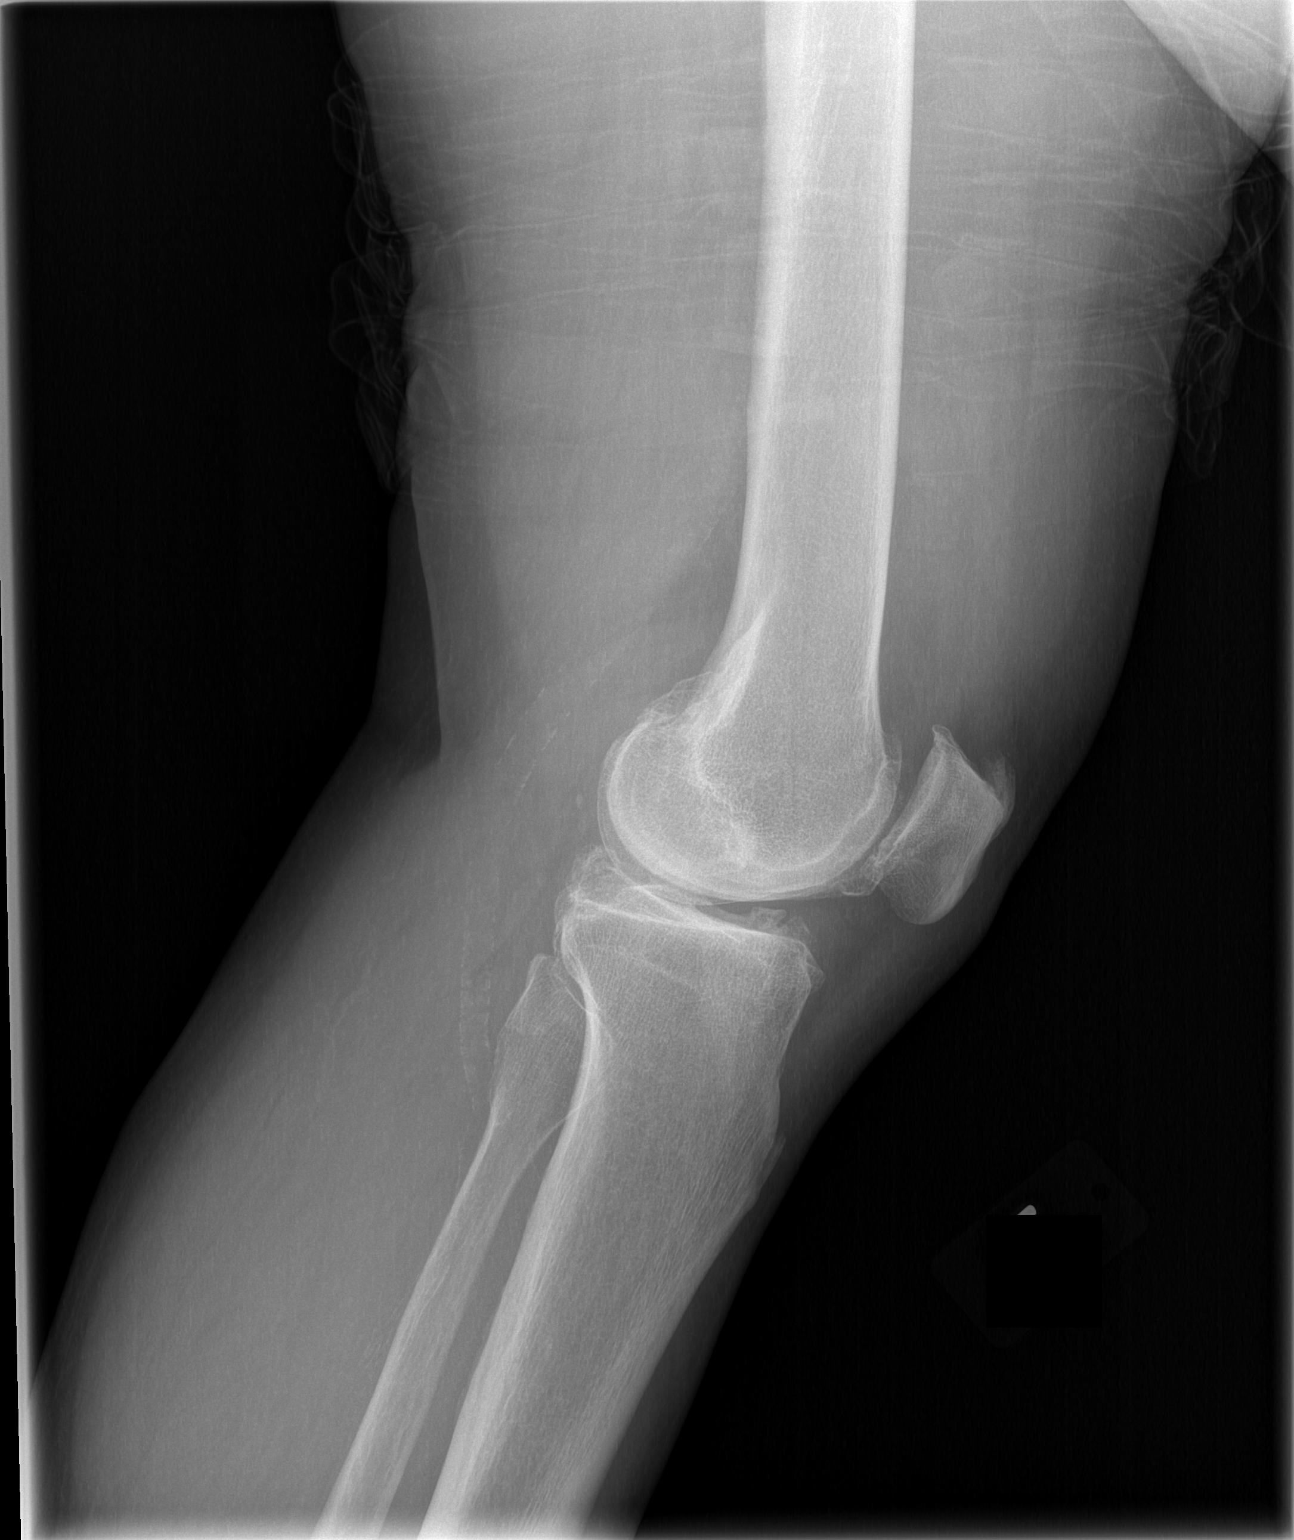

[2 of 2 positions shown; findings below may reference images not displayed]

FINDINGS: Moderate to advanced osteoarthritis with joint space
narrowing and spurring most prominent in the medial compartment and
the patellofemoral compartment.  There is a moderately large joint
effusion.  No fracture.  There is popliteal artery calcification.
IMPRESSION: Moderate to advanced osteoarthritis with effusion.  Negative for
fracture.

## 2015-03-03 ENCOUNTER — Emergency Department (HOSPITAL_COMMUNITY): Payer: Medicare HMO

## 2015-03-03 ENCOUNTER — Observation Stay (HOSPITAL_COMMUNITY)
Admission: EM | Admit: 2015-03-03 | Discharge: 2015-03-06 | Disposition: A | Discharge status: Skilled Nursing Facility | Payer: Medicare HMO | Attending: Internal Medicine | Admitting: Internal Medicine

## 2015-03-03 ENCOUNTER — Encounter (HOSPITAL_COMMUNITY): Payer: Self-pay | Admitting: Emergency Medicine

## 2015-03-03 DIAGNOSIS — W009XXA Unspecified fall due to ice and snow, initial encounter: Secondary | ICD-10-CM | POA: Diagnosis present

## 2015-03-03 DIAGNOSIS — M1990 Unspecified osteoarthritis, unspecified site: Secondary | ICD-10-CM | POA: Insufficient documentation

## 2015-03-03 DIAGNOSIS — Z791 Long term (current) use of non-steroidal anti-inflammatories (NSAID): Secondary | ICD-10-CM | POA: Diagnosis not present

## 2015-03-03 DIAGNOSIS — K219 Gastro-esophageal reflux disease without esophagitis: Secondary | ICD-10-CM | POA: Insufficient documentation

## 2015-03-03 DIAGNOSIS — E119 Type 2 diabetes mellitus without complications: Secondary | ICD-10-CM | POA: Diagnosis not present

## 2015-03-03 DIAGNOSIS — Z87891 Personal history of nicotine dependence: Secondary | ICD-10-CM | POA: Diagnosis not present

## 2015-03-03 DIAGNOSIS — I10 Essential (primary) hypertension: Secondary | ICD-10-CM | POA: Diagnosis present

## 2015-03-03 DIAGNOSIS — Z7982 Long term (current) use of aspirin: Secondary | ICD-10-CM | POA: Diagnosis not present

## 2015-03-03 DIAGNOSIS — R339 Retention of urine, unspecified: Secondary | ICD-10-CM | POA: Diagnosis not present

## 2015-03-03 DIAGNOSIS — M25561 Pain in right knee: Secondary | ICD-10-CM | POA: Diagnosis not present

## 2015-03-03 DIAGNOSIS — M25552 Pain in left hip: Secondary | ICD-10-CM | POA: Diagnosis present

## 2015-03-03 DIAGNOSIS — Z7901 Long term (current) use of anticoagulants: Secondary | ICD-10-CM | POA: Insufficient documentation

## 2015-03-03 DIAGNOSIS — Y92009 Unspecified place in unspecified non-institutional (private) residence as the place of occurrence of the external cause: Secondary | ICD-10-CM | POA: Diagnosis not present

## 2015-03-03 DIAGNOSIS — Z79899 Other long term (current) drug therapy: Secondary | ICD-10-CM | POA: Insufficient documentation

## 2015-03-03 DIAGNOSIS — S32039A Unspecified fracture of third lumbar vertebra, initial encounter for closed fracture: Secondary | ICD-10-CM | POA: Diagnosis not present

## 2015-03-03 DIAGNOSIS — M1711 Unilateral primary osteoarthritis, right knee: Secondary | ICD-10-CM | POA: Insufficient documentation

## 2015-03-03 DIAGNOSIS — M1612 Unilateral primary osteoarthritis, left hip: Secondary | ICD-10-CM | POA: Insufficient documentation

## 2015-03-03 DIAGNOSIS — R51 Headache: Secondary | ICD-10-CM | POA: Diagnosis not present

## 2015-03-03 DIAGNOSIS — S32029A Unspecified fracture of second lumbar vertebra, initial encounter for closed fracture: Secondary | ICD-10-CM | POA: Diagnosis not present

## 2015-03-03 DIAGNOSIS — W19XXXA Unspecified fall, initial encounter: Secondary | ICD-10-CM

## 2015-03-03 DIAGNOSIS — I739 Peripheral vascular disease, unspecified: Secondary | ICD-10-CM | POA: Diagnosis present

## 2015-03-03 DIAGNOSIS — S32008A Other fracture of unspecified lumbar vertebra, initial encounter for closed fracture: Secondary | ICD-10-CM

## 2015-03-03 DIAGNOSIS — S32009A Unspecified fracture of unspecified lumbar vertebra, initial encounter for closed fracture: Secondary | ICD-10-CM | POA: Diagnosis present

## 2015-03-03 DIAGNOSIS — Z96652 Presence of left artificial knee joint: Secondary | ICD-10-CM | POA: Insufficient documentation

## 2015-03-03 DIAGNOSIS — Z794 Long term (current) use of insulin: Secondary | ICD-10-CM | POA: Insufficient documentation

## 2015-03-03 DIAGNOSIS — IMO0001 Reserved for inherently not codable concepts without codable children: Secondary | ICD-10-CM

## 2015-03-03 DIAGNOSIS — G44319 Acute post-traumatic headache, not intractable: Secondary | ICD-10-CM | POA: Diagnosis not present

## 2015-03-03 DIAGNOSIS — R29898 Other symptoms and signs involving the musculoskeletal system: Secondary | ICD-10-CM

## 2015-03-03 HISTORY — DX: Peripheral vascular disease, unspecified: I73.9

## 2015-03-03 LAB — CBC
HCT: 47.9 % (ref 39.0–52.0)
HEMOGLOBIN: 16.6 g/dL (ref 13.0–17.0)
MCH: 31.5 pg (ref 26.0–34.0)
MCHC: 34.7 g/dL (ref 30.0–36.0)
MCV: 90.9 fL (ref 78.0–100.0)
Platelets: 233 10*3/uL (ref 150–400)
RBC: 5.27 MIL/uL (ref 4.22–5.81)
RDW: 12.3 % (ref 11.5–15.5)
WBC: 11.6 10*3/uL — AB (ref 4.0–10.5)

## 2015-03-03 LAB — PROTIME-INR
INR: 1 (ref 0.00–1.49)
PROTHROMBIN TIME: 13.4 s (ref 11.6–15.2)

## 2015-03-03 LAB — I-STAT CHEM 8, ED
BUN: 20 mg/dL (ref 6–20)
CALCIUM ION: 1.09 mmol/L — AB (ref 1.13–1.30)
Chloride: 103 mmol/L (ref 101–111)
Creatinine, Ser: 1.2 mg/dL (ref 0.61–1.24)
Glucose, Bld: 188 mg/dL — ABNORMAL HIGH (ref 65–99)
HCT: 49 % (ref 39.0–52.0)
HEMOGLOBIN: 16.7 g/dL (ref 13.0–17.0)
Potassium: 4.2 mmol/L (ref 3.5–5.1)
SODIUM: 140 mmol/L (ref 135–145)
TCO2: 26 mmol/L (ref 0–100)

## 2015-03-03 LAB — GLUCOSE, CAPILLARY: GLUCOSE-CAPILLARY: 117 mg/dL — AB (ref 65–99)

## 2015-03-03 MED ORDER — MORPHINE SULFATE (PF) 4 MG/ML IV SOLN: 4.0000 mg | Freq: Once | INTRAVENOUS | Status: DC

## 2015-03-03 MED ORDER — HYDROMORPHONE HCL 1 MG/ML IJ SOLN
0.5000 mg | Freq: Once | INTRAMUSCULAR | Status: AC
  Administered 2015-03-03: 0.5 mg via INTRAVENOUS
  Filled 2015-03-03: qty 1

## 2015-03-03 MED ORDER — INSULIN GLARGINE 100 UNIT/ML ~~LOC~~ SOLN
50.0000 [IU] | Freq: Every day | SUBCUTANEOUS | Status: DC
  Administered 2015-03-03 – 2015-03-05 (×3): 50 [IU] via SUBCUTANEOUS
  Filled 2015-03-03 (×3): qty 0.5

## 2015-03-03 MED ORDER — INSULIN ASPART 100 UNIT/ML ~~LOC~~ SOLN
0.0000 [IU] | Freq: Three times a day (TID) | SUBCUTANEOUS | Status: DC
  Administered 2015-03-06: 3 [IU] via SUBCUTANEOUS

## 2015-03-03 MED ORDER — CENTRUM SILVER ADULT 50+ PO TABS: 1.0000 | ORAL_TABLET | Freq: Every day | ORAL | Status: DC

## 2015-03-03 MED ORDER — ENOXAPARIN SODIUM 40 MG/0.4ML ~~LOC~~ SOLN
40.0000 mg | SUBCUTANEOUS | Status: DC
  Administered 2015-03-03 – 2015-03-05 (×3): 40 mg via SUBCUTANEOUS
  Filled 2015-03-03 (×4): qty 0.4

## 2015-03-03 MED ORDER — BISMUTH SUBSALICYLATE 262 MG/15ML PO SUSP
30.0000 mL | Freq: Four times a day (QID) | ORAL | Status: DC | PRN
  Filled 2015-03-03: qty 118

## 2015-03-03 MED ORDER — HYDROMORPHONE HCL 1 MG/ML IJ SOLN: 1.0000 mg | INTRAMUSCULAR | Status: DC | PRN

## 2015-03-03 MED ORDER — HYDROMORPHONE HCL 1 MG/ML IJ SOLN
1.0000 mg | Freq: Once | INTRAMUSCULAR | Status: AC
  Administered 2015-03-03: 1 mg via INTRAVENOUS
  Filled 2015-03-03: qty 1

## 2015-03-03 MED ORDER — FLEET ENEMA 7-19 GM/118ML RE ENEM: 1.0000 | ENEMA | Freq: Once | RECTAL | Status: DC | PRN

## 2015-03-03 MED ORDER — NALOXONE HCL 0.4 MG/ML IJ SOLN: 0.4000 mg | INTRAMUSCULAR | Status: DC | PRN

## 2015-03-03 MED ORDER — ACETAMINOPHEN 650 MG RE SUPP: 650.0000 mg | Freq: Four times a day (QID) | RECTAL | Status: DC | PRN

## 2015-03-03 MED ORDER — ASPIRIN EC 81 MG PO TBEC
81.0000 mg | DELAYED_RELEASE_TABLET | Freq: Every day | ORAL | Status: DC
  Administered 2015-03-03 – 2015-03-06 (×4): 81 mg via ORAL
  Filled 2015-03-03 (×4): qty 1

## 2015-03-03 MED ORDER — HYDROMORPHONE HCL 1 MG/ML IJ SOLN: 0.5000 mg | Freq: Once | INTRAMUSCULAR | Status: DC

## 2015-03-03 MED ORDER — SODIUM CHLORIDE 0.9 % IJ SOLN: 3.0000 mL | INTRAMUSCULAR | Status: DC | PRN

## 2015-03-03 MED ORDER — ACETAMINOPHEN 325 MG PO TABS: 650.0000 mg | ORAL_TABLET | Freq: Four times a day (QID) | ORAL | Status: DC | PRN

## 2015-03-03 MED ORDER — SODIUM CHLORIDE 0.9 % IV SOLN: 250.0000 mL | INTRAVENOUS | Status: DC | PRN

## 2015-03-03 MED ORDER — ONDANSETRON HCL 4 MG/2ML IJ SOLN: 4.0000 mg | Freq: Four times a day (QID) | INTRAMUSCULAR | Status: DC | PRN

## 2015-03-03 MED ORDER — KETOROLAC TROMETHAMINE 15 MG/ML IJ SOLN: 15.0000 mg | Freq: Four times a day (QID) | INTRAMUSCULAR | Status: DC | PRN

## 2015-03-03 MED ORDER — ONDANSETRON HCL 4 MG/2ML IJ SOLN: 4.0000 mg | Freq: Three times a day (TID) | INTRAMUSCULAR | Status: DC | PRN

## 2015-03-03 MED ORDER — SODIUM CHLORIDE 0.9 % IJ SOLN
3.0000 mL | Freq: Two times a day (BID) | INTRAMUSCULAR | Status: DC
  Administered 2015-03-03 – 2015-03-06 (×5): 3 mL via INTRAVENOUS

## 2015-03-03 MED ORDER — ONDANSETRON HCL 4 MG PO TABS: 4.0000 mg | ORAL_TABLET | Freq: Four times a day (QID) | ORAL | Status: DC | PRN

## 2015-03-03 MED ORDER — CYCLOBENZAPRINE HCL 10 MG PO TABS
10.0000 mg | ORAL_TABLET | Freq: Three times a day (TID) | ORAL | Status: DC | PRN
  Administered 2015-03-03 – 2015-03-06 (×4): 10 mg via ORAL
  Filled 2015-03-03 (×4): qty 1

## 2015-03-03 MED ORDER — HYDROCODONE-ACETAMINOPHEN 5-325 MG PO TABS
1.0000 | ORAL_TABLET | ORAL | Status: DC | PRN
  Administered 2015-03-03: 1 via ORAL
  Administered 2015-03-04: 2 via ORAL
  Administered 2015-03-04: 1 via ORAL
  Administered 2015-03-04 – 2015-03-06 (×6): 2 via ORAL
  Filled 2015-03-03 (×4): qty 2
  Filled 2015-03-03: qty 1
  Filled 2015-03-03: qty 2
  Filled 2015-03-03: qty 1
  Filled 2015-03-03 (×2): qty 2

## 2015-03-03 MED ORDER — SENNOSIDES-DOCUSATE SODIUM 8.6-50 MG PO TABS: 1.0000 | ORAL_TABLET | Freq: Every evening | ORAL | Status: DC | PRN

## 2015-03-03 MED ORDER — ADULT MULTIVITAMIN W/MINERALS CH
1.0000 | ORAL_TABLET | Freq: Every day | ORAL | Status: DC
  Administered 2015-03-04 – 2015-03-06 (×3): 1 via ORAL
  Filled 2015-03-03 (×3): qty 1

## 2015-03-03 MED ORDER — HYDRALAZINE HCL 20 MG/ML IJ SOLN: 10.0000 mg | INTRAMUSCULAR | Status: DC | PRN

## 2015-03-03 MED ORDER — IOHEXOL 300 MG/ML  SOLN
100.0000 mL | Freq: Once | INTRAMUSCULAR | Status: AC | PRN
  Administered 2015-03-03: 100 mL via INTRAVENOUS

## 2015-03-03 MED ORDER — MORPHINE SULFATE (PF) 4 MG/ML IV SOLN
4.0000 mg | Freq: Once | INTRAVENOUS | Status: AC
  Administered 2015-03-03: 4 mg via INTRAVENOUS
  Filled 2015-03-03: qty 1

## 2015-03-03 MED ORDER — OXYCODONE-ACETAMINOPHEN 5-325 MG PO TABS: 2.0000 | ORAL_TABLET | Freq: Once | ORAL | Status: DC

## 2015-03-03 MED ORDER — BISACODYL 5 MG PO TBEC
5.0000 mg | DELAYED_RELEASE_TABLET | Freq: Every day | ORAL | Status: DC | PRN
  Administered 2015-03-04: 5 mg via ORAL
  Filled 2015-03-03 (×2): qty 1

## 2015-03-03 NOTE — ED Provider Notes (Signed)
CSN: [id number]     Arrival date & time 03/03/15  1401 History   First MD Initiated Contact with Patient 03/03/15 1429     Chief Complaint  Patient presents with  . Fall  . Head Injury  . Knee Injury  . Hip Pain     (Consider location/radiation/quality/duration/timing/severity/associated sxs/prior Treatment) HPI 70 year old male with history of hypertension and diabetes who presents after mechanical fall. Does not take any anticoagulation. Slipped on ice behind his house and fell backwards onto left hip and then head. Thinks he may have had LOC. Unable to stand and ambulate after fall. Complains of lef thip pain and then right knee pain on EMS ride to ED. Denies chest pain, abdominal pain, back pain, neck pain, or difficulty breathing, Otherwise in usual state of health prior to accident.    Past Medical History  Diagnosis Date  . Diabetes mellitus   . Anginal pain ([place])     "when gets angry the muscle cuts off blood flow in artery"  . Hypertension     "on benicar for kidneys"  . GERD (gastroesophageal reflux disease)   . Arthritis    Past Surgical History  Procedure Laterality Date  . Foot surgery Right 1982  . Foot surgery Right 1985  . Appendectomy    . Hernia repair    . Colonoscopy  01/19/2011    Procedure: COLONOSCOPY;  Surgeon: Rogene Houston, MD;  Location: [facility];  Service: Endoscopy;  Laterality: N/A;  9:30  . Colonoscopy N/A 04/12/2012    Procedure: COLONOSCOPY;  Surgeon: Rogene Houston, MD;  Location: [facility];  Service: Endoscopy;  Laterality: N/A;  1200  . Total knee arthroplasty Left 06/14/2012    Procedure: LEFT TOTAL KNEE ARTHROPLASTY;  Surgeon: Johnn Hai, MD;  Location: [facility];  Service: Orthopedics;  Laterality: Left;   No family history on file. Social History  Substance Use Topics  . Smoking status: Former Smoker -- 0.25 packs/day for 20 years    Types: Cigarettes    Start date: 02/22/1964    Quit date: 02/22/1988  . Smokeless  tobacco: Never Used  . Alcohol Use: No    Review of Systems  10/14 systems reviewed and are negative other than those stated in the HPI   Allergies  Review of patient's allergies indicates no known allergies.  Home Medications   Prior to Admission medications   Medication Sig Start Date End Date Taking? Authorizing Provider  aspirin EC 81 MG tablet Take 81 mg by mouth daily.   Yes Historical Provider, MD  bismuth subsalicylate (PEPTO BISMOL) 262 MG/15ML suspension Take 30 mLs by mouth every 6 (six) hours as needed for indigestion or diarrhea or loose stools.   Yes Historical Provider, MD  ibuprofen (ADVIL,MOTRIN) 200 MG tablet Take 800 mg by mouth every 6 (six) hours as needed for moderate pain.   Yes Historical Provider, MD  insulin glargine (LANTUS) 100 UNIT/ML injection Inject 60-65 Units into the skin 2 (two) times daily. Takes 65 in the morning and 60 in the evening   Yes Historical Provider, MD  metFORMIN (GLUCOPHAGE) 1000 MG tablet Take 1,000 mg by mouth 2 (two) times daily with a meal.   Yes Historical Provider, MD  Multiple Vitamins-Minerals (CENTRUM SILVER ADULT 50+) TABS Take 1 tablet by mouth daily.   Yes Historical Provider, MD  rivaroxaban (XARELTO) 10 MG TABS tablet Take 1 tablet (10 mg total) by mouth daily. Patient not taking: Reported on 03/03/2015 06/14/12  Cecilie Kicks, PA-C   BP 126/95 mmHg  Pulse 85  Temp(Src) 98.4 F (36.9 C)  Resp 14  SpO2 98% Physical Exam Physical Exam  Nursing note and vitals reviewed. Constitutional: Well developed, well nourished, non-toxic, and in no acute distress Head: Normocephalic and atraumatic.  Mouth/Throat: Oropharynx is clear and moist.  Neck: Normal range of motion. Neck supple. No cervical spine tenderness. Cardiovascular: Normal rate and regular rhythm.  +2 DP pulses bilaterally Pulmonary/Chest: Effort normal and breath sounds normal. No chest wall tenderness Abdominal: Soft. There is no tenderness. There is no  rebound and no guarding.  There is lower left flank tenderness to palpation. No bruising or deformities Musculoskeletal: Normal range of motion of upper extremities. Intact ROM of bilateral lower extremities, slowed due to low back pain Neurological: Alert, no facial droop, fluent speech, moves all extremities symmetrically, sensation to light touch in tact bilaterally Skin: Skin is warm and dry.  Psychiatric: Cooperative  ED Course  Procedures (including critical care time) Labs Review Labs Reviewed  I-STAT CHEM 8, ED - Abnormal; Notable for the following:    Glucose, Bld 188 (*)    Calcium, Ion 1.09 (*)    All other components within normal limits    Imaging Review Ct Head Wo Contrast  03/03/2015  CLINICAL DATA:  Fall on porch with head injury, headache and reported loss of consciousness. Initial encounter. EXAM: CT HEAD WITHOUT CONTRAST CT CERVICAL SPINE WITHOUT CONTRAST TECHNIQUE: Multidetector CT imaging of the head and cervical spine was performed following the standard protocol without intravenous contrast. Multiplanar CT image reconstructions of the cervical spine were also generated. COMPARISON:  None. FINDINGS: CT HEAD FINDINGS The brain demonstrates no evidence of hemorrhage, infarction, edema, mass effect, extra-axial fluid collection, hydrocephalus or mass lesion. The skull is unremarkable. CT CERVICAL SPINE FINDINGS The cervical spine shows normal alignment. There is no evidence of acute fracture or subluxation. No soft tissue swelling or hematoma is identified. Moderate spondylosis present at the C4-5, C5-6 and C6-7 levels. No bony or soft tissue lesions are seen. The visualized airway is normally patent. IMPRESSION: 1. Normal head CT. 2. No acute cervical injury identified. Moderate multilevel spondylosis present of the cervical spine. Electronically Signed   By: Aletta Edouard M.D.   On: 03/03/2015 15:27   Ct Cervical Spine Wo Contrast  03/03/2015  CLINICAL DATA:  Fall on  porch with head injury, headache and reported loss of consciousness. Initial encounter. EXAM: CT HEAD WITHOUT CONTRAST CT CERVICAL SPINE WITHOUT CONTRAST TECHNIQUE: Multidetector CT imaging of the head and cervical spine was performed following the standard protocol without intravenous contrast. Multiplanar CT image reconstructions of the cervical spine were also generated. COMPARISON:  None. FINDINGS: CT HEAD FINDINGS The brain demonstrates no evidence of hemorrhage, infarction, edema, mass effect, extra-axial fluid collection, hydrocephalus or mass lesion. The skull is unremarkable. CT CERVICAL SPINE FINDINGS The cervical spine shows normal alignment. There is no evidence of acute fracture or subluxation. No soft tissue swelling or hematoma is identified. Moderate spondylosis present at the C4-5, C5-6 and C6-7 levels. No bony or soft tissue lesions are seen. The visualized airway is normally patent. IMPRESSION: 1. Normal head CT. 2. No acute cervical injury identified. Moderate multilevel spondylosis present of the cervical spine. Electronically Signed   By: Aletta Edouard M.D.   On: 03/03/2015 15:27   Ct Abdomen Pelvis W Contrast  03/03/2015  CLINICAL DATA:  Fall today with left flank and hip pain. EXAM: CT ABDOMEN AND PELVIS  WITH CONTRAST TECHNIQUE: Multidetector CT imaging of the abdomen and pelvis was performed using the standard protocol following bolus administration of intravenous contrast. CONTRAST:  167mL OMNIPAQUE IOHEXOL 300 MG/ML  SOLN COMPARISON:  None. FINDINGS: Lower chest and abdominal wall: Wide necked fatty umbilical hernia. There changes of low right abdominal wall hernia and repair. Fatty enlargement of the bilateral inguinal canal, left more than right. Subcutaneous reticulation in the lower abdominal wall compatible with medication injection sites. Hepatobiliary: Prominent caudate lobe and central fissures but no definitive surface changes of cirrhosis.No evidence of biliary obstruction  or stone. Pancreas: Unremarkable. Spleen: Unremarkable. Adrenals/Urinary Tract: Negative adrenals. 4 mm left renal calculus. No hydronephrosis or ureteral stone. Small renal hilar cyst on the right. Unremarkable bladder. Reproductive:Mild prostate enlargement deforming the bladder base. Stomach/Bowel:  No obstruction. Appendectomy. Vascular/Lymphatic: No acute vascular abnormality. No mass or adenopathy. Peritoneal: No ascites or pneumoperitoneum. Musculoskeletal: L2 and L3 left transverse process fractures with mild distraction but no displacement. Bulky lumbar and lower thoracic spondylosis. IMPRESSION: 1. L2 and L3 left transverse process fractures, nondisplaced. 2. No evidence of intra-abdominal injury. 3. Left nephrolithiasis and other incidental findings are described above. Electronically Signed   By: Monte Fantasia M.D.   On: 03/03/2015 17:22   Dg Knee Complete 4 Views Right  03/03/2015  CLINICAL DATA:  Fall.  Pain.  Initial evaluation. EXAM: RIGHT KNEE - COMPLETE 4+ VIEW COMPARISON:  No prior. FINDINGS: Small knee joint effusion cannot be excluded. Bipartite patella noted. Small bony densities noted adjacent to the of superior lateral aspect patella could represent old fracture fragments. Tricompartment degenerative change. Degenerative changes most prominent about the medial compartment. Peripheral vascular calcification. IMPRESSION: 1. Bipartite patella. Tiny bony densities noted adjacent to the superior lateral aspect of patella could represent small fracture chips. 2. Severe tricompartment degenerative change. Degenerative changes most prominent about the medial compartment. 3. Small knee joint effusion cannot be excluded. 4. Peripheral vascular disease. Electronically Signed   By: Marcello Moores  Register   On: 03/03/2015 15:00   Dg Hip Unilat W Or W/o Pelvis 2-3 Views Left  03/03/2015  CLINICAL DATA:  Fall.  Initial evaluation . EXAM: DG HIP (WITH OR WITHOUT PELVIS) 2-3V LEFT COMPARISON:  None.  FINDINGS: Degenerative changes lumbar spine and both hips. No acute bony or joint abnormality identified. No evidence fracture dislocation. Vascular calcification noted. Surgical clip right pelvis . IMPRESSION: 1. Degenerative changes lumbar spine and both hips. No acute bony abnormality. 2.  Peripheral vascular disease . Electronically Signed   By: Marcello Moores  Register   On: 03/03/2015 14:57   I have personally reviewed and evaluated these images and lab results as part of my medical decision-making.   EKG Interpretation None      MDM   Final diagnoses:  Lumbar transverse process fracture, closed, initial encounter ([place])  Fall, initial encounter    70 year old male who presents with low back and hip pain after mechanical fall. He is nontoxic in no acute distress, but does appear very uncomfortable due to pain. No obvious signs of trauma, but with significant pain to palpation of the left lower flank and with range of motion of the left hip. CT head and cervical spine shows no acute injuries involving the head or cervical spine. X-rays of his right knee and left hip and pelvis shows no major traumatic injuries but evidence of some osteoarthritis. Due to persistent pain primarily in his left lower flank, CT abdomen and pelvis was performed. He shows evidence of an L2  and L3 transverse process fracture on the left. He is neurovascularly intact. This is discussed with Dr. Kathyrn Sheriff from neurosurgery who recommended lumbar corset brace, which is ordered for the patient. He recommends outpatient follow-up with him in 4 weeks. Patient has had very difficulty getting pain under control and has received multiple doses of morphine and Tylox, with inability to ambulate due to pain. I've discussed this patient with [facility] hospitalist will admit to observation for pain control.     Forde Dandy, MD 03/03/15 [phone]

## 2015-03-03 NOTE — ED Notes (Signed)
Pt placed on 2l of oxygen for oxygen saturation of 87-89% while sleeping.

## 2015-03-03 NOTE — ED Notes (Signed)
Called [employer] and placed order for lumbar corsett. Phone # [phone]

## 2015-03-03 NOTE — ED Notes (Addendum)
Per [facility] EMS pt tripped on back porch and fell hitting right knee, right hip and head. No deformity or shortening noted. No had injury noted. Originally on the scene pt report not LOC but did report LOC at the hospital with this nurse and EMS. Pt reported 10/10 pain and was given 4mg  Morphine IM.

## 2015-03-03 NOTE — ED Notes (Signed)
Unit called to inform them that there will be a delay in transporting pt. We are waiting on [employer] to bring and apply the back splint.

## 2015-03-03 NOTE — ED Notes (Signed)
Floor called to inform them that the pt is on their way up and they stated the room is still not clean.

## 2015-03-03 NOTE — H&P (Addendum)
[facility] History and Physical  [name] [id number] DOB: Apr 26, 1945 DOA: 03/03/2015  Referring physician: ED physician PCP: Stephens Shire, MD   Chief Complaint:  Left hip pain, right knee pain following fall   HPI: [name] is a 70 y.o. male with PMH of insulin-dependent diabetes mellitus, hypertension, and osteoarthritis status post left TKA who presents to the ED with EMS following a mechanical fall at home. Patient had been in his usual state of health when he was going up the back door on the evening of 03/03/2015 and slipped on ice, falling onto his left side and then striking the back of his head on the ground. There was a brief loss of consciousness and immediate, severe pain at the left hip and right knee. When his wife approached him, he was reportedly alert with no appreciable confusion or disorientation. He was unable to get up off the ground due to pain and EMS was activated. On EMS arrival, patient was alert and oriented 4 and interacting appropriately. He was treated with 4 mg IV morphine en route to the hospital.  In ED, patient was found to be afebrile, saturating well on room air, and with vital signs stable. Noncontrast CT of the head and C-spine were obtained, demonstrating normal CT head and no acute injury to the cervical spine. Plain radiographs of the hips and knee were obtained and notable for degenerative disease, revealing tiny bony densities adjacent to the superior lateral aspect of the right patella, possibly suggestive of fracture chips. Patient was given another 4 mg of IV morphine in the emergency department due to severe pain, and CT of the pelvis was obtained. CT pelvis demonstrated acute, nondisplaced fractures of the left transverse processes at L2 and L3. Neurosurgeon, Dr. Lajuana Ripple was consulted from the emergency department and recommended outpatient follow-up, with admission for pain control if necessary. Patient remained in 10/10 pain, was  treated with IV Dilaudid, and the hospitalists were asked to admit.  Where does patient live?   At home     Can patient participate in ADLs?  Yes       Review of Systems:   General: no fevers, chills, sweats, weight change, poor appetite, or fatigue HEENT: no blurry vision, hearing changes or sore throat Pulm: no dyspnea, cough, or wheeze CV: no chest pain or palpitations Abd: no nausea, vomiting, abdominal pain, or constipation. Diarrhea 1 wk ago, resolved  GU: no dysuria, hematuria, increased urinary frequency, or urgency  Ext: no leg edema Neuro: no focal weakness, no vision change or hearing loss. Paraesthesias, left hip and leg. Headache.  Skin: no rash, no wounds MSK: No muscle spasm, no deformity, no red, hot, or swollen joint Heme: No easy bruising or bleeding Travel history: No recent long distant travel    Allergy: No Known Allergies  Past Medical History  Diagnosis Date  . Diabetes mellitus   . Anginal pain ([place])     "when gets angry the muscle cuts off blood flow in artery"  . Hypertension     "on benicar for kidneys"  . GERD (gastroesophageal reflux disease)   . Arthritis     Past Surgical History  Procedure Laterality Date  . Foot surgery Right 1982  . Foot surgery Right 1985  . Appendectomy    . Hernia repair    . Colonoscopy  01/19/2011    Procedure: COLONOSCOPY;  Surgeon: Rogene Houston, MD;  Location: [facility];  Service: Endoscopy;  Laterality: N/A;  9:30  .  Colonoscopy N/A 04/12/2012    Procedure: COLONOSCOPY;  Surgeon: Rogene Houston, MD;  Location: [facility];  Service: Endoscopy;  Laterality: N/A;  1200  . Total knee arthroplasty Left 06/14/2012    Procedure: LEFT TOTAL KNEE ARTHROPLASTY;  Surgeon: Johnn Hai, MD;  Location: [facility];  Service: Orthopedics;  Laterality: Left;  . Joint replacement      Social History:  reports that he quit smoking about 27 years ago. His smoking use included Cigarettes. He started smoking about 51 years  ago. He has a 5 pack-year smoking history. He has never used smokeless tobacco. He reports that he does not drink alcohol or use illicit drugs.  Family History:  Family History  Problem Relation Age of Onset  . Cancer Mother   . Heart disease Father      Prior to Admission medications   Medication Sig Start Date End Date Taking? Authorizing Provider  aspirin EC 81 MG tablet Take 81 mg by mouth daily.   Yes Historical Provider, MD  bismuth subsalicylate (PEPTO BISMOL) 262 MG/15ML suspension Take 30 mLs by mouth every 6 (six) hours as needed for indigestion or diarrhea or loose stools.   Yes Historical Provider, MD  ibuprofen (ADVIL,MOTRIN) 200 MG tablet Take 800 mg by mouth every 6 (six) hours as needed for moderate pain.   Yes Historical Provider, MD  insulin glargine (LANTUS) 100 UNIT/ML injection Inject 60-65 Units into the skin 2 (two) times daily. Takes 65 in the morning and 60 in the evening   Yes Historical Provider, MD  metFORMIN (GLUCOPHAGE) 1000 MG tablet Take 1,000 mg by mouth 2 (two) times daily with a meal.   Yes Historical Provider, MD  Multiple Vitamins-Minerals (CENTRUM SILVER ADULT 50+) TABS Take 1 tablet by mouth daily.   Yes Historical Provider, MD    Physical Exam: Filed Vitals:   03/03/15 1408 03/03/15 1421 03/03/15 1901  BP: 118/95 126/95 159/68  Pulse:  85 94  Temp: 98.4 F (36.9 C) 98.4 F (36.9 C)   TempSrc: Oral    Resp: 14  16  SpO2: 96% 98% 94%   General: Not in acute distress HEENT:       Eyes: PERRL, EOMI, no scleral icterus or conjunctival pallor.       ENT: No discharge from the ears or nose, no pharyngeal ulcers, petechiae or exudate, no tonsillar enlargement.        Neck: No JVD, no bruit, no appreciable mass Heme: No cervical adenopathy, no pallor Cardiac: S1/S2, RRR, No murmurs, No gallops or rubs. Pulm: Good air movement bilaterally. No rales, wheezing, rhonchi or rubs. Abd: Soft, nondistended, nontender, no rebound pain or gaurding, no mass  or organomegaly, BS present. Ext: No LE edema bilaterally. 2+DP/PT pulse bilaterally. Sensation to light touch diminished in left foot relative to right.  Musculoskeletal: No gross deformity, no red, hot, swollen joints, exquisite soft-tissue tenderness about lateral aspect left hip and knee. ROM severely limited by pain  Skin: No rashes or wounds on exposed surfaces  Neuro: Alert, oriented X3, cranial nerves II-XII grossly intact, muscle strength 5/5 in upper extremities, left hip and knee strength testing limited by pain, left dorsi- and plantarflexion 5/5. Sensation to light touch diminished in left foot relative to right. Brachial reflex 2+ bilaterally. Knee reflex 2+ bilaterally.  Psych: Patient is not overtly psychotic, mood and affect appropriate.  Labs on Admission:  Basic Metabolic Panel:  Recent Labs Lab 03/03/15 1625  NA 140  K 4.2  CL 103  GLUCOSE 188*  BUN 20  CREATININE 1.20   Liver Function Tests: No results for input(s): AST, ALT, ALKPHOS, BILITOT, PROT, ALBUMIN in the last 168 hours. No results for input(s): LIPASE, AMYLASE in the last 168 hours. No results for input(s): AMMONIA in the last 168 hours. CBC:  Recent Labs Lab 03/03/15 1625  HGB 16.7  HCT 49.0   Cardiac Enzymes: No results for input(s): CKTOTAL, CKMB, CKMBINDEX, TROPONINI in the last 168 hours.  BNP (last 3 results) No results for input(s): BNP in the last 8760 hours.  ProBNP (last 3 results) No results for input(s): PROBNP in the last 8760 hours.  CBG: No results for input(s): GLUCAP in the last 168 hours.  Radiological Exams on Admission: Ct Head Wo Contrast  03/03/2015  CLINICAL DATA:  Fall on porch with head injury, headache and reported loss of consciousness. Initial encounter. EXAM: CT HEAD WITHOUT CONTRAST CT CERVICAL SPINE WITHOUT CONTRAST TECHNIQUE: Multidetector CT imaging of the head and cervical spine was performed following the standard protocol without intravenous contrast.  Multiplanar CT image reconstructions of the cervical spine were also generated. COMPARISON:  None. FINDINGS: CT HEAD FINDINGS The brain demonstrates no evidence of hemorrhage, infarction, edema, mass effect, extra-axial fluid collection, hydrocephalus or mass lesion. The skull is unremarkable. CT CERVICAL SPINE FINDINGS The cervical spine shows normal alignment. There is no evidence of acute fracture or subluxation. No soft tissue swelling or hematoma is identified. Moderate spondylosis present at the C4-5, C5-6 and C6-7 levels. No bony or soft tissue lesions are seen. The visualized airway is normally patent. IMPRESSION: 1. Normal head CT. 2. No acute cervical injury identified. Moderate multilevel spondylosis present of the cervical spine. Electronically Signed   By: Aletta Edouard M.D.   On: 03/03/2015 15:27   Ct Cervical Spine Wo Contrast  03/03/2015  CLINICAL DATA:  Fall on porch with head injury, headache and reported loss of consciousness. Initial encounter. EXAM: CT HEAD WITHOUT CONTRAST CT CERVICAL SPINE WITHOUT CONTRAST TECHNIQUE: Multidetector CT imaging of the head and cervical spine was performed following the standard protocol without intravenous contrast. Multiplanar CT image reconstructions of the cervical spine were also generated. COMPARISON:  None. FINDINGS: CT HEAD FINDINGS The brain demonstrates no evidence of hemorrhage, infarction, edema, mass effect, extra-axial fluid collection, hydrocephalus or mass lesion. The skull is unremarkable. CT CERVICAL SPINE FINDINGS The cervical spine shows normal alignment. There is no evidence of acute fracture or subluxation. No soft tissue swelling or hematoma is identified. Moderate spondylosis present at the C4-5, C5-6 and C6-7 levels. No bony or soft tissue lesions are seen. The visualized airway is normally patent. IMPRESSION: 1. Normal head CT. 2. No acute cervical injury identified. Moderate multilevel spondylosis present of the cervical spine.  Electronically Signed   By: Aletta Edouard M.D.   On: 03/03/2015 15:27   Ct Abdomen Pelvis W Contrast  03/03/2015  CLINICAL DATA:  Fall today with left flank and hip pain. EXAM: CT ABDOMEN AND PELVIS WITH CONTRAST TECHNIQUE: Multidetector CT imaging of the abdomen and pelvis was performed using the standard protocol following bolus administration of intravenous contrast. CONTRAST:  153mL OMNIPAQUE IOHEXOL 300 MG/ML  SOLN COMPARISON:  None. FINDINGS: Lower chest and abdominal wall: Wide necked fatty umbilical hernia. There changes of low right abdominal wall hernia and repair. Fatty enlargement of the bilateral inguinal canal, left more than right. Subcutaneous reticulation in the lower abdominal wall compatible with medication injection sites. Hepatobiliary: Prominent caudate lobe and central fissures  but no definitive surface changes of cirrhosis.No evidence of biliary obstruction or stone. Pancreas: Unremarkable. Spleen: Unremarkable. Adrenals/Urinary Tract: Negative adrenals. 4 mm left renal calculus. No hydronephrosis or ureteral stone. Small renal hilar cyst on the right. Unremarkable bladder. Reproductive:Mild prostate enlargement deforming the bladder base. Stomach/Bowel:  No obstruction. Appendectomy. Vascular/Lymphatic: No acute vascular abnormality. No mass or adenopathy. Peritoneal: No ascites or pneumoperitoneum. Musculoskeletal: L2 and L3 left transverse process fractures with mild distraction but no displacement. Bulky lumbar and lower thoracic spondylosis. IMPRESSION: 1. L2 and L3 left transverse process fractures, nondisplaced. 2. No evidence of intra-abdominal injury. 3. Left nephrolithiasis and other incidental findings are described above. Electronically Signed   By: Monte Fantasia M.D.   On: 03/03/2015 17:22   Dg Knee Complete 4 Views Right  03/03/2015  CLINICAL DATA:  Fall.  Pain.  Initial evaluation. EXAM: RIGHT KNEE - COMPLETE 4+ VIEW COMPARISON:  No prior. FINDINGS: Small knee joint  effusion cannot be excluded. Bipartite patella noted. Small bony densities noted adjacent to the of superior lateral aspect patella could represent old fracture fragments. Tricompartment degenerative change. Degenerative changes most prominent about the medial compartment. Peripheral vascular calcification. IMPRESSION: 1. Bipartite patella. Tiny bony densities noted adjacent to the superior lateral aspect of patella could represent small fracture chips. 2. Severe tricompartment degenerative change. Degenerative changes most prominent about the medial compartment. 3. Small knee joint effusion cannot be excluded. 4. Peripheral vascular disease. Electronically Signed   By: Marcello Moores  Register   On: 03/03/2015 15:00   Dg Hip Unilat W Or W/o Pelvis 2-3 Views Left  03/03/2015  CLINICAL DATA:  Fall.  Initial evaluation . EXAM: DG HIP (WITH OR WITHOUT PELVIS) 2-3V LEFT COMPARISON:  None. FINDINGS: Degenerative changes lumbar spine and both hips. No acute bony or joint abnormality identified. No evidence fracture dislocation. Vascular calcification noted. Surgical clip right pelvis . IMPRESSION: 1. Degenerative changes lumbar spine and both hips. No acute bony abnormality. 2.  Peripheral vascular disease . Electronically Signed   By: Marcello Moores  Register   On: 03/03/2015 14:57    EKG:  Not done in ED, will obtain as appropriate   Assessment/Plan  1. Mechanical fall with acute fracture of L2 & L3 left transverse processes - Slipped on ice, falling onto left side and striking head  - Dr. Lajuana Ripple recommends acute treatment with lumbar corset brace and analgesia, then outpt follow-up  - Lumbar corset brace ordered, will be applied  - Pain-control with ketorolac, Dilaudid prn; muscle relaxant may help relieve traction on the fractured transverse processes, will try Flexeril prn    - Left leg paraesthesias present, secondary to acute trauma, no cord compression on CT, motor function intact in left foot, DP pulses 2+ b/l   - Monitor with neurovascular checks q4h overnight  - VTE ppx with sq Lovenox, SCDs; can d/c SCDs if too painful for pt    2. Headache, post-traumatic, acute  - Non-contrast head CT with no acute findings, pt A&O x4, CN II-XII intact  - Given high-risk for DVT following this injury and no suggestion of bleed on CT, will use pharmacologic VTE ppx, following neuro status closely  - Repeat imaging for change in neuro status    3. Insulin-dependent DM 2  - On Lantus 60-65 units BID with metformin at home  - CBGs with meals and at bedtime  - Holding metformin, dropping basal insulin dose to 50 BID for now and using a SSI correctional  - Will increase basal dose as  needed based on CBGs  - A1c pending   4. Hypertension  - Pt was previously treated with an ARB after coughing on ACEi - Reports being off antihypertensive medications for about 2 yrs now  - Normotensive thus far in the ED  - Monitor, treat prn   DVT ppx: SQ Lovenox, SCDs  Code Status: Full code Family Communication:  Yes, patient's wife, son, and daughter at bed side Disposition Plan: Admit to inpatient   Date of Service 03/03/2015    Vianne Bulls, MD [facility] Pager [phone]  If 7PM-7AM, please contact night-coverage [email or web address] Password [email or web address] 03/03/2015, 7:30 PM

## 2015-03-03 NOTE — ED Notes (Signed)
Pt says he slipped on ice and Pt reports hitting the back of his head and reports LOC. Pt able to answer questions.

## 2015-03-03 NOTE — ED Notes (Signed)
Bed: WA02 Expected date:  Expected time:  Means of arrival:  Comments: Ems fall  

## 2015-03-04 ENCOUNTER — Observation Stay (HOSPITAL_COMMUNITY): Payer: Medicare HMO

## 2015-03-04 DIAGNOSIS — E119 Type 2 diabetes mellitus without complications: Secondary | ICD-10-CM | POA: Diagnosis not present

## 2015-03-04 DIAGNOSIS — I10 Essential (primary) hypertension: Secondary | ICD-10-CM | POA: Diagnosis not present

## 2015-03-04 DIAGNOSIS — S32008A Other fracture of unspecified lumbar vertebra, initial encounter for closed fracture: Secondary | ICD-10-CM | POA: Diagnosis not present

## 2015-03-04 DIAGNOSIS — Z794 Long term (current) use of insulin: Secondary | ICD-10-CM | POA: Diagnosis not present

## 2015-03-04 LAB — GLUCOSE, CAPILLARY
GLUCOSE-CAPILLARY: 92 mg/dL (ref 65–99)
GLUCOSE-CAPILLARY: 236 mg/dL — AB (ref 65–99)
Glucose-Capillary: 156 mg/dL — ABNORMAL HIGH (ref 65–99)

## 2015-03-04 NOTE — Clinical Social Work Placement (Signed)
   CLINICAL SOCIAL WORK PLACEMENT  NOTE  Date:  03/04/2015  Patient Details  Name: [name] MRN: [id number] Date of Birth: Apr 16, 1945  Clinical Social Work is seeking post-discharge placement for this patient at the [place] level of care (*CSW will initial, date and re-position this form in  chart as items are completed):  Yes   Patient/family provided with [facility] Work Department's list of facilities offering this level of care within the geographic area requested by the patient (or if unable, by the patient's family).  Yes   Patient/family informed of their freedom to choose among providers that offer the needed level of care, that participate in Medicare, Medicaid or managed care program needed by the patient, have an available bed and are willing to accept the patient.  Yes   Patient/family informed of [facility]'s ownership interest in [facility] and [facility], as well as of the fact that they are under no obligation to receive care at these facilities.  PASRR submitted to EDS on 02/25/15     PASRR number received on 03/04/15     Existing PASRR number confirmed on       FL2 transmitted to all facilities in geographic area requested by pt/family on 03/04/15     FL2 transmitted to all facilities within larger geographic area on       Patient informed that his/her managed care company has contracts with or will negotiate with certain facilities, including the following:            Patient/family informed of bed offers received.  Patient chooses bed at       Physician recommends and patient chooses bed at      Patient to be transferred to   on  .  Patient to be transferred to facility by       Patient family notified on   of transfer.  Name of family member notified:        PHYSICIAN       Additional Comment:    _______________________________________________ Luretha Rued, [place] 03/04/2015, 1:32  PM

## 2015-03-04 NOTE — Progress Notes (Signed)
OT Cancellation Note  Patient Details Name: [name] MRN: [id number] DOB: 24-Aug-1945   Cancelled Treatment:    Reason Eval/Treat Not Completed: Other (comment)  Noted order to start tomorrow. We also need clarification on brace:  Donned supine or EOB.  Is this on only when OOB?  Thanks.   Corbet Hanley 03/04/2015, 9:43 AM  [name], OTR/L [phone] 03/04/2015

## 2015-03-04 NOTE — Care Management Obs Status (Signed)
MEDICARE OBSERVATION STATUS NOTIFICATION   Patient Details  Name: [name] MRN: [id number] Date of Birth: [birth year]   Medicare Observation Status Notification Given:  Yes, discussed with spouse and pt at bedside.     CrutchfieldAntony Haste, RN 03/04/2015, 11:40 AM

## 2015-03-04 NOTE — Care Management Note (Signed)
Case Management Note  Patient Details  Name: [name] MRN: [id number] Date of Birth: 02/23/1945  Subjective/Objective:   70 y.o. M admitted 03/03/2015 after falling on ice. Pt has been seen by PT who is recommending STSNF as he has severe pain and poor ability to ambulate.                  Action/Plan:Deferred disposition to CSW as spouse/pt/and PT all agree STSNF will be required for pt prior to his ability to go home.    Expected Discharge Date:   (unknown)               Expected Discharge Plan:  Skilled Nursing Facility  In-House Referral:  Clinical Social Work  Discharge planning Services  CM Consult  Post Acute Care Choice:    Choice offered to:  Patient, Spouse  DME Arranged:    DME Agency:     HH Arranged:    [place] Agency:     Status of Service:  In process, will continue to follow  Medicare Important Message Given:    Date Medicare IM Given:    Medicare IM give by:    Date Additional Medicare IM Given:    Additional Medicare Important Message give by:     If discussed at [place] of Stay Meetings, dates discussed:    Additional Comments:  Delrae Sawyers, RN 03/04/2015, 2:35 PM

## 2015-03-04 NOTE — Clinical Social Work Note (Signed)
Clinical Social Work Assessment  Patient Details  Name: [name] MRN: [id number] Date of Birth: November 06, 1945  Date of referral:  03/04/15               Reason for consult:  Discharge Planning                Permission sought to share information with:    Permission granted to share information::  Yes, Verbal Permission Granted  Name::        Agency::     Relationship::     Contact Information:     Housing/Transportation Living arrangements for the past 2 months:  Single Family Home Source of Information:  Patient, Spouse Patient Interpreter Needed:  None Criminal Activity/Legal Involvement Pertinent to Current Situation/Hospitalization:  No - Comment as needed Significant Relationships:  Adult Children, Spouse Lives with:    Do you feel safe going back to the place where you live?  No (SNF recommended) Need for family participation in patient care:  No (Com[place])  Care giving concerns:  Pt's care cannot be managed at home following hospital d/c.   Social Worker assessment / plan:  Pt hospitalized on 03/03/15 after falling at home. CSW met with pt spouse to assist with d/c planning. PT has recommended SNF placement at d/c. Pt / spouse are in agreement with this plan. SNF search initiated and pt has requested [facility] for rehab. Bed offers are pending. Pt has [employer] which requires prior authorization. CSW will help with this process.  Employment status:  Retired Forensic scientist:  Managed Care PT Recommendations:  [place] / Referral to community resources:  [place]  Patient/Family's Response to care:  Pt / spouse feel rehab is needed.  Patient/Family's Understanding of and Emotional Response to Diagnosis, Current Treatment, and Prognosis:   Pt / spouse are aware of pt's medical status. Pt reports increased pain. " I want to go home but my wife can't take care of me like this. " Support provided.  Emotional  Assessment Appearance:  Appears stated age Attitude/Demeanor/Rapport:  Unable to Assess (cooperative) Affect (typically observed):  Appropriate Orientation:  Oriented to Self, Oriented to Place, Oriented to  Time, Oriented to Situation Alcohol / Substance use:  Not Applicable Psych involvement (Current and /or in the community):  No (Comment)  Discharge Needs  Concerns to be addressed:  Discharge Planning Concerns Readmission within the last 30 days:  No Current discharge risk:  None Barriers to Discharge:  No Barriers Identified   Luretha Rued, [place] 03/04/2015, 1:26 PM

## 2015-03-04 NOTE — Progress Notes (Signed)
Patient Demographics  [name], is a 70 y.o. male, DOB - 09/16/1945, [id number]  Admit date - 03/03/2015   Admitting Physician Vianne Bulls, MD  Outpatient Primary MD for the patient is Stephens Shire, MD  LOS -    Chief Complaint  Patient presents with  . Fall  . Head Injury  . Knee Injury  . Hip Pain       Admission HPI/Brief narrative: 71 y.o. male with PMH of insulin-dependent diabetes mellitus, hypertension, and osteoarthritis status post left TKA who presents to the ED with EMS following mechanical fall at home, workup was significant for acute fracture of L2 and L3 left transverse process, as well as right knee Tiny bony densities noted adjacent to the superior lateral aspect of patella could represent small fracture chips, into consulted with neurosurgery who recommended outpatient follow-up, patient was admitted for pain control and PT evaluation.   Subjective:   [name] today has, No headache, No chest pain, No abdominal pain , patient complains of lower back pain, left knee pain.  Assessment & Plan    Principal Problem:   Lumbar transverse process fracture [facility]) Active Problems:   Essential hypertension   Insulin dependent diabetes mellitus ([place])   Fall from slipping on ice   Headache, post-traumatic, acute   Fracture of lumbar spine without cord injury ([place])  Mechanical fall with acute fracture of L2 & L3 left transverse processes - Slipped on ice, falling onto left side and striking head  - Dr. Lajuana Ripple recommends acute treatment with lumbar corset brace and analgesia, then outpt follow-up  - Left leg paraesthesias present, secondary to acute trauma, no cord compression on CT, MRI lumbar spine obtained as well with no evidence of unstable fracture,  motor function intact in left foot, DP pulses 2+ b/l  - Patient was seen by PT, they recommended SNF placement,  social worker consulted  Insulin-dependent DM 2  - On Lantus 60-65 units BID with metformin at home  - Holding metformin, dropping basal insulin dose to 50 BID for now and using a SSI correctional  - Will increase basal dose as needed based on CBGs  - A1c pending   Hypertension  - Acceptable, not on home medication  Code Status: Full  Family Communication: Wife at bedside  Disposition Plan: Will need SNF placement   Procedures  none   Consults   ED physician discussed with neurosurgery on admission   Medications  Scheduled Meds: . aspirin EC  81 mg Oral Daily  . enoxaparin (LOVENOX) injection  40 mg Subcutaneous Q24H  . insulin aspart  0-15 Units Subcutaneous TID WC  . insulin glargine  50 Units Subcutaneous QHS  . multivitamin with minerals  1 tablet Oral Daily  . sodium chloride  3 mL Intravenous Q12H   Continuous Infusions:  PRN Meds:.sodium chloride, acetaminophen **OR** acetaminophen, bisacodyl, bismuth subsalicylate, cyclobenzaprine, hydrALAZINE, HYDROcodone-acetaminophen, HYDROmorphone (DILAUDID) injection, ketorolac, naLOXone (NARCAN)  injection, ondansetron **OR** ondansetron (ZOFRAN) IV, senna-docusate, sodium chloride, sodium phosphate  DVT Prophylaxis  Lovenox   Lab Results  Component Value Date   PLT 233 03/03/2015    Antibiotics   Anti-infectives    None          Objective:   Filed  Vitals:   03/03/15 1421 03/03/15 1901 03/03/15 2035 03/04/15 0518  BP: 126/95 159/68 128/73 141/70  Pulse: 85 94 95 86  Temp: 98.4 F (36.9 C)  98.9 F (37.2 C) 98.5 F (36.9 C)  TempSrc:   Oral Oral  Resp:  16 16 16   Height:   5\' 5"  (1.651 m)   Weight:   94.6 kg (208 lb 8.9 oz)   SpO2: 98% 94% 95% 97%    Wt Readings from Last 3 Encounters:  03/03/15 94.6 kg (208 lb 8.9 oz)  06/14/12 103 kg (227 lb 1.2 oz)  06/04/12 103.874 kg (229 lb)     Intake/Output Summary (Last 24 hours) at 03/04/15 1653 Last data filed at 03/04/15 1440  Gross per 24  hour  Intake    480 ml  Output    420 ml  Net     60 ml     Physical Exam  Awake Alert, Oriented X 3,  [place].AT,PERRAL Supple Neck,No JVD, No cervical lymphadenopathy appriciated.  Symmetrical Chest wall movement, Good air movement bilaterally, CTAB RRR,No Gallops,Rubs or new Murmurs, No Parasternal Heave +ve B.Sounds, Abd Soft, No tenderness, No organomegaly appriciated, No rebound - guarding or rigidity. No Cyanosis, Clubbing or edema, No new Rash or bruise  , diminished sensation left foot   Data Review   Micro Results No results found for this or any previous visit (from the past 240 hour(s)).  Radiology Reports Ct Head Wo Contrast  03/03/2015  CLINICAL DATA:  Fall on porch with head injury, headache and reported loss of consciousness. Initial encounter. EXAM: CT HEAD WITHOUT CONTRAST CT CERVICAL SPINE WITHOUT CONTRAST TECHNIQUE: Multidetector CT imaging of the head and cervical spine was performed following the standard protocol without intravenous contrast. Multiplanar CT image reconstructions of the cervical spine were also generated. COMPARISON:  None. FINDINGS: CT HEAD FINDINGS The brain demonstrates no evidence of hemorrhage, infarction, edema, mass effect, extra-axial fluid collection, hydrocephalus or mass lesion. The skull is unremarkable. CT CERVICAL SPINE FINDINGS The cervical spine shows normal alignment. There is no evidence of acute fracture or subluxation. No soft tissue swelling or hematoma is identified. Moderate spondylosis present at the C4-5, C5-6 and C6-7 levels. No bony or soft tissue lesions are seen. The visualized airway is normally patent. IMPRESSION: 1. Normal head CT. 2. No acute cervical injury identified. Moderate multilevel spondylosis present of the cervical spine. Electronically Signed   By: Aletta Edouard M.D.   On: 03/03/2015 15:27   Ct Cervical Spine Wo Contrast  03/03/2015  CLINICAL DATA:  Fall on porch with head injury, headache and reported loss of  consciousness. Initial encounter. EXAM: CT HEAD WITHOUT CONTRAST CT CERVICAL SPINE WITHOUT CONTRAST TECHNIQUE: Multidetector CT imaging of the head and cervical spine was performed following the standard protocol without intravenous contrast. Multiplanar CT image reconstructions of the cervical spine were also generated. COMPARISON:  None. FINDINGS: CT HEAD FINDINGS The brain demonstrates no evidence of hemorrhage, infarction, edema, mass effect, extra-axial fluid collection, hydrocephalus or mass lesion. The skull is unremarkable. CT CERVICAL SPINE FINDINGS The cervical spine shows normal alignment. There is no evidence of acute fracture or subluxation. No soft tissue swelling or hematoma is identified. Moderate spondylosis present at the C4-5, C5-6 and C6-7 levels. No bony or soft tissue lesions are seen. The visualized airway is normally patent. IMPRESSION: 1. Normal head CT. 2. No acute cervical injury identified. Moderate multilevel spondylosis present of the cervical spine. Electronically Signed   By: Aletta Edouard  M.D.   On: 03/03/2015 15:27   Mr Lumbar Spine Wo Contrast  03/04/2015  CLINICAL DATA:  Fall at home on ice, onto left side, transverse process fractures at L2 and L3 EXAM: MRI LUMBAR SPINE WITHOUT CONTRAST TECHNIQUE: Multiplanar, multisequence MR imaging of the lumbar spine was performed. No intravenous contrast was administered. COMPARISON:  03/03/2015 CT FINDINGS: Distended urinary bladder. The lowest lumbar type non-rib-bearing vertebra is labeled as L5. The conus medullaris appears normal. Conus level: T12-L1. Very subtly displaced acute left transverse process tip fractures at L2 and L3 with subtle adjacent edema in the adjacent soft tissues. No other fractures are identified. Small peripelvic cysts in the left kidney lower pole. No significant paraspinal or psoas hematoma. There is 3 mm of degenerative anterolisthesis at L5-S1. Additional findings at individual levels are as follows:  L1-2:  Unremarkable. L2-3: Mild bilateral foraminal stenosis and mild displacement of both L2 nerves in the lateral extraforaminal space due to diffuse disc bulge. L3-4: Mild right and mild-to-moderate left foraminal stenosis with moderate left and mild right displacement of the L3 nerves in the lateral extraforaminal space, and mild left subarticular lateral recess stenosis, due to left eccentric disc bulge and left paracentral disc protrusion L4-5: Mild to moderate bilateral subarticular lateral recess stenosis with mild to moderate left and mild right foraminal stenosis and mild displacement of both L4 nerves in the lateral extraforaminal space as well as borderline central narrowing of the thecal sac due to diffuse disc bulge, intervertebral spurring, and facet arthropathy. L5-S1: Mild left and borderline right foraminal stenosis due to disc bulge and small left foraminal disc protrusion. IMPRESSION: 1. Minimally displaced left transverse process tip fractures at L2 and L3. These are not unstable fractures. Mild surrounding soft tissue edema without significant hematoma in the vicinity of these fractures. No other fractures in the lumbar spine. 2. Lumbar spondylosis and degenerative disc disease cause mild to moderate impingement at L4-5 and mild impingement at L2-3, L3-4, and L5-S1, as detailed above. Electronically Signed   By: Van Clines M.D.   On: 03/04/2015 14:03   Ct Abdomen Pelvis W Contrast  03/03/2015  CLINICAL DATA:  Fall today with left flank and hip pain. EXAM: CT ABDOMEN AND PELVIS WITH CONTRAST TECHNIQUE: Multidetector CT imaging of the abdomen and pelvis was performed using the standard protocol following bolus administration of intravenous contrast. CONTRAST:  119mL OMNIPAQUE IOHEXOL 300 MG/ML  SOLN COMPARISON:  None. FINDINGS: Lower chest and abdominal wall: Wide necked fatty umbilical hernia. There changes of low right abdominal wall hernia and repair. Fatty enlargement of the  bilateral inguinal canal, left more than right. Subcutaneous reticulation in the lower abdominal wall compatible with medication injection sites. Hepatobiliary: Prominent caudate lobe and central fissures but no definitive surface changes of cirrhosis.No evidence of biliary obstruction or stone. Pancreas: Unremarkable. Spleen: Unremarkable. Adrenals/Urinary Tract: Negative adrenals. 4 mm left renal calculus. No hydronephrosis or ureteral stone. Small renal hilar cyst on the right. Unremarkable bladder. Reproductive:Mild prostate enlargement deforming the bladder base. Stomach/Bowel:  No obstruction. Appendectomy. Vascular/Lymphatic: No acute vascular abnormality. No mass or adenopathy. Peritoneal: No ascites or pneumoperitoneum. Musculoskeletal: L2 and L3 left transverse process fractures with mild distraction but no displacement. Bulky lumbar and lower thoracic spondylosis. IMPRESSION: 1. L2 and L3 left transverse process fractures, nondisplaced. 2. No evidence of intra-abdominal injury. 3. Left nephrolithiasis and other incidental findings are described above. Electronically Signed   By: Monte Fantasia M.D.   On: 03/03/2015 17:22   Dg Knee Complete  4 Views Left  03/04/2015  CLINICAL DATA:  [place] yesterday and injured left knee. History of total knee arthroplasty. EXAM: LEFT KNEE - COMPLETE 4+ VIEW COMPARISON:  Radiographs 06/14/2012 FINDINGS: The femoral and tibial components are well seated. No complicating features. No acute fracture. No joint effusion. Extensive vascular calcifications. IMPRESSION: No acute bony findings or complicating features associated with the total left knee arthroplasty. No joint effusion. Electronically Signed   By: Marijo Sanes M.D.   On: 03/04/2015 16:39   Dg Knee Complete 4 Views Right  03/03/2015  CLINICAL DATA:  Fall.  Pain.  Initial evaluation. EXAM: RIGHT KNEE - COMPLETE 4+ VIEW COMPARISON:  No prior. FINDINGS: Small knee joint effusion cannot be excluded. Bipartite  patella noted. Small bony densities noted adjacent to the of superior lateral aspect patella could represent old fracture fragments. Tricompartment degenerative change. Degenerative changes most prominent about the medial compartment. Peripheral vascular calcification. IMPRESSION: 1. Bipartite patella. Tiny bony densities noted adjacent to the superior lateral aspect of patella could represent small fracture chips. 2. Severe tricompartment degenerative change. Degenerative changes most prominent about the medial compartment. 3. Small knee joint effusion cannot be excluded. 4. Peripheral vascular disease. Electronically Signed   By: Marcello Moores  Register   On: 03/03/2015 15:00   Dg Hip Unilat W Or W/o Pelvis 2-3 Views Left  03/03/2015  CLINICAL DATA:  Fall.  Initial evaluation . EXAM: DG HIP (WITH OR WITHOUT PELVIS) 2-3V LEFT COMPARISON:  None. FINDINGS: Degenerative changes lumbar spine and both hips. No acute bony or joint abnormality identified. No evidence fracture dislocation. Vascular calcification noted. Surgical clip right pelvis . IMPRESSION: 1. Degenerative changes lumbar spine and both hips. No acute bony abnormality. 2.  Peripheral vascular disease . Electronically Signed   By: Marcello Moores  Register   On: 03/03/2015 14:57     CBC  Recent Labs Lab 03/03/15 1625 03/03/15 1953  WBC  --  11.6*  HGB 16.7 16.6  HCT 49.0 47.9  PLT  --  233  MCV  --  90.9  MCH  --  31.5  MCHC  --  34.7  RDW  --  12.3    Chemistries   Recent Labs Lab 03/03/15 1625  NA 140  K 4.2  CL 103  GLUCOSE 188*  BUN 20  CREATININE 1.20   ------------------------------------------------------------------------------------------------------------------ estimated creatinine clearance is 60.5 mL/min (by C-G formula based on Cr of 1.2). ------------------------------------------------------------------------------------------------------------------ No results for input(s): HGBA1C in the last 72  hours. ------------------------------------------------------------------------------------------------------------------ No results for input(s): CHOL, HDL, LDLCALC, TRIG, CHOLHDL, LDLDIRECT in the last 72 hours. ------------------------------------------------------------------------------------------------------------------ No results for input(s): TSH, T4TOTAL, T3FREE, THYROIDAB in the last 72 hours.  Invalid input(s): FREET3 ------------------------------------------------------------------------------------------------------------------ No results for input(s): VITAMINB12, FOLATE, FERRITIN, TIBC, IRON, RETICCTPCT in the last 72 hours.  Coagulation profile  Recent Labs Lab 03/03/15 1953  INR 1.00    No results for input(s): DDIMER in the last 72 hours.  Cardiac Enzymes No results for input(s): CKMB, TROPONINI, MYOGLOBIN in the last 168 hours.  Invalid input(s): CK ------------------------------------------------------------------------------------------------------------------ Invalid input(s): POCBNP     Time Spent in minutes   25 minutes   ELGERGAWY, DAWOOD M.D on 03/04/2015 at 4:53 PM  Between 7am to 7pm - Pager - [phone]  After 7pm go to [email or web address] - password [facility]  [facility]   Office  [phone]

## 2015-03-04 NOTE — NC FL2 (Signed)
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 Insulin dependent diabetes mellitus ([place]) 03/03/2015  . Fall from slipping on ice 03/03/2015  . Osteoarthritis 03/03/2015  . Lumbar transverse process fracture ([place]) 03/03/2015  . Headache, post-traumatic, acute 03/03/2015  . Fracture of lumbar spine without cord injury ([place]) 03/03/2015  . Left knee DJD 06/14/2012  . Hx of adenomatous colonic polyps 01/19/2011  . HYPERLIPIDEMIA-MIXED 11/24/2008  . Essential hypertension 11/24/2008  . PALPITATIONS 11/24/2008    Orientation RESPIRATION BLADDER Height & Weight    Self, Time, Situation, Place  Normal Continent 5\' 5"  (165.1 cm) 208 lbs.  BEHAVIORAL SYMPTOMS/MOOD NEUROLOGICAL BOWEL NUTRITION STATUS  Other (Comment) (no behaviors)   Continent Diet  AMBULATORY STATUS COMMUNICATION OF NEEDS Skin   Extensive Assist Verbally Normal                       Personal Care Assistance Level of Assistance  Bathing, Feeding, Dressing Bathing Assistance: Limited assistance Feeding assistance: Independent Dressing Assistance: Limited assistance     Functional Limitations Info  Sight, Hearing, Speech Sight Info: Adequate Hearing Info: Adequate Speech Info: Adequate    SPECIAL CARE FACTORS FREQUENCY  PT (By licensed PT), OT (By  licensed OT)     PT Frequency: 5 x wk OT Frequency: 5 x wk            Contractures Contractures Info: Not present    Additional Factors Info  Code Status Code Status Info: Full Code             Current Medications (03/04/2015):  This is the current hospital active medication list Current Facility-Administered Medications  Medication Dose Route Frequency Provider Last Rate Last Dose  . 0.9 %  sodium chloride infusion  250 mL Intravenous PRN Vianne Bulls, MD      . acetaminophen (TYLENOL) tablet 650 mg  650 mg Oral Q6H PRN Vianne Bulls, MD       Or  . acetaminophen (TYLENOL) suppository 650 mg  650 mg Rectal Q6H PRN Vianne Bulls, MD      . aspirin EC tablet 81 mg  81 mg Oral Daily Vianne Bulls, MD   81 mg at 03/04/15 0939  . bisacodyl (DULCOLAX) EC tablet 5 mg  5 mg Oral Daily PRN Vianne Bulls, MD      . bismuth subsalicylate (PEPTO BISMOL) 262 MG/15ML suspension 30 mL  30 mL Oral Q6H PRN Vianne Bulls, MD      . cyclobenzaprine (FLEXERIL) tablet 10 mg  10 mg Oral TID PRN Vianne Bulls, MD   10 mg at 03/04/15 1051  . enoxaparin (LOVENOX) injection 40 mg  40 mg Subcutaneous Q24H Vianne Bulls, MD   40 mg at 03/03/15 2223  . hydrALAZINE (APRESOLINE)  injection 10 mg  10 mg Intravenous Q4H PRN Vianne Bulls, MD      . HYDROcodone-acetaminophen (NORCO/VICODIN) 5-325 MG per tablet 1-2 tablet  1-2 tablet Oral Q4H PRN Vianne Bulls, MD   1 tablet at 03/04/15 1051  . HYDROmorphone (DILAUDID) injection 1 mg  1 mg Intravenous Q2H PRN Ilene Qua Opyd, MD      . insulin aspart (novoLOG) injection 0-15 Units  0-15 Units Subcutaneous TID WC Vianne Bulls, MD   0 Units at 03/04/15 0800  . insulin glargine (LANTUS) injection 50 Units  50 Units Subcutaneous QHS Vianne Bulls, MD   50 Units at 03/03/15 2224  . ketorolac (TORADOL) 15 MG/ML injection 15 mg  15 mg Intravenous Q6H PRN Vianne Bulls, MD      . multivitamin with minerals tablet 1 tablet  1 tablet Oral Daily Ilene Qua  Opyd, MD      . naloxone (NARCAN) injection 0.4 mg  0.4 mg Intravenous PRN Vianne Bulls, MD      . ondansetron (ZOFRAN) tablet 4 mg  4 mg Oral Q6H PRN Vianne Bulls, MD       Or  . ondansetron (ZOFRAN) injection 4 mg  4 mg Intravenous Q6H PRN Ilene Qua Opyd, MD      . senna-docusate (Senokot-S) tablet 1 tablet  1 tablet Oral QHS PRN Ilene Qua Opyd, MD      . sodium chloride 0.9 % injection 3 mL  3 mL Intravenous Q12H Vianne Bulls, MD   3 mL at 03/03/15 2225  . sodium chloride 0.9 % injection 3 mL  3 mL Intravenous PRN Vianne Bulls, MD      . sodium phosphate (FLEET) 7-19 GM/118ML enema 1 enema  1 enema Rectal Once PRN Vianne Bulls, MD         Discharge Medications: Please see discharge summary for a list of discharge medications.  Relevant Imaging Results:  Relevant Lab Results:   Additional Information SS # [id number]  Kemper Hochman, Randall An, LCSW

## 2015-03-04 NOTE — Evaluation (Signed)
Physical Therapy Evaluation Patient Details Name: [name] MRN: [id number] DOB: 1945/07/03 Today's Date: 03/04/2015   History of Present Illness  [name] is a 70 y.o. male with PMH of insulin-dependent diabetes mellitus, hypertension, and osteoarthritis status post left TKA who presents to the ED 03/03/15  with EMS following a mechanical fall at home. On the evening of 03/03/2015 and slipped on ice, falling onto his left side and then striking the back of his head on the ground. There was a brief loss of consciousness and immediate, severe pain at the left hip and right knee. When his wife approached him, he was reportedly alert with no appreciable confusion or disorientation. He was unable to get up off the ground due to pain and EMS was activated. CT pelvis demonstrated acute, nondisplaced fractures of the left transverse processes at L2 and L3.Neurosurgery consulted and recommended F/U as OP. ordered lumbar corset.  Clinical Impression  Ptient  Demonstrates impaired sensation and strength on L LE, increased pain in back and reports shooting pain down the left leg. Notified Dr. Marvel Plan of  This and that patient is requiring 2 total assist  For mobility at this time. Unable to bear weight  On the Left leg for taking a step.  Pt admitted with above diagnosis. Pt currently with functional limitations due to the deficits listed below (see PT Problem List).  Pt will benefit from skilled PT to increase their independence and safety with mobility to allow discharge to the venue listed below.       Follow Up Recommendations SNF;Supervision/Assistance - 24 hour    Equipment Recommendations  None recommended by PT    Recommendations for Other Services       Precautions / Restrictions Precautions Precautions: Back Precaution Comments: no orders to clarfy when to place brace. PT placed in supine. Required Braces or Orthoses: Spinal Brace Spinal Brace: Lumbar corset      Mobility  Bed  Mobility Overal bed mobility: Needs Assistance Bed Mobility: Rolling;Sidelying to Sit;Sit to Sidelying Rolling: Max assist;+2 for safety/equipment Sidelying to sit: Total assist;+2 for physical assistance;+2 for safety/equipment;HOB elevated     Sit to sidelying: Total assist;+2 for physical assistance;+2 for safety/equipment;HOB elevated General bed mobility comments: bed pad used to assist with rolling to L and R, brace applied with 2 assist in supine via rolling. Assisted patient to his Left side. HOB raised slowly to assist to sitting, required total assist to assume fully upright. Total assist back to L side/sidelyng, assist with trunk and legs onto bed. Purple slide sheet used to facilitate repositioning in the bed.  Transfers Overall transfer level: Needs assistance Equipment used: Rolling walker (2 wheeled) Transfers: Sit to/from Stand Sit to Stand: Max assist;+2 physical assistance;+2 safety/equipment;From elevated surface         General transfer comment: Assist to power up to stand at the RW, decreased weight on the L leg and  noted to buckle with spasm like movement.Stood x 30 secs, bed pulled down to better position patient when back to bed. Patient was unable to  take any steps due to increased back and L leg pain and Left leg  instability.  Ambulation/Gait             General Gait Details: unable.  Stairs            Wheelchair Mobility    Modified Rankin (Stroke Patients Only)       Balance Overall balance assessment: Needs assistance;History of Falls Sitting-balance support:  Bilateral upper extremity supported;Feet supported Sitting balance-Leahy Scale: Poor Sitting balance - Comments: unable to move arms   Standing balance support: Bilateral upper extremity supported;During functional activity Standing balance-Leahy Scale: Poor                               Pertinent Vitals/Pain Pain Assessment: 0-10 Pain Score: 10-Worst pain  ever Pain Descriptors / Indicators: Contraction;Shooting;Spasm;Guarding;Grimacing;Numbness Pain Intervention(s): Limited activity within patient's tolerance;Premedicated before session;Repositioned    Home Living Family/patient expects to be discharged to:: Private residence Living Arrangements: Spouse/significant other Available Help at Discharge: Family Type of Home: House Home Access: Stairs to enter Entrance Stairs-Rails: Right;Left;Can reach both Technical brewer of Steps: 3 Home Layout: One level Home Equipment: Environmental consultant - 2 wheels      Prior Function Level of Independence: Independent               Hand Dominance        Extremity/Trunk Assessment   Upper Extremity Assessment: Defer to OT evaluation           Lower Extremity Assessment: RLE deficits/detail;LLE deficits/detail RLE Deficits / Details: grossly WFL LLE Deficits / Details: toe extension 2, dorsiflexion2, knee extension 3+, hip flexion 3. All attempts to move the  leg cause pain in the l back and reports" shooting pain down the leg", patient has paresthesia of the L foot and toes.  Cervical / Trunk Assessment: Other exceptions  Communication   Communication: No difficulties  Cognition Arousal/Alertness: Awake/alert Behavior During Therapy: WFL for tasks assessed/performed;Anxious Overall Cognitive Status: Within Functional Limits for tasks assessed                      General Comments      Exercises        Assessment/Plan    PT Assessment Patient needs continued PT services  PT Diagnosis Difficulty walking;Acute pain   PT Problem List Decreased strength;Decreased activity tolerance;Decreased balance;Decreased mobility;Impaired sensation;Decreased knowledge of precautions;Decreased safety awareness;Decreased knowledge of use of DME;Pain  PT Treatment Interventions DME instruction;Gait training;Functional mobility training;Stair training;Therapeutic activities;Therapeutic  exercise;Patient/family education   PT Goals (Current goals can be found in the Care Plan section) Acute Rehab PT Goals Patient Stated Goal: I want to get better. PT Goal Formulation: With patient/family Time For Goal Achievement: 03/11/15 Potential to Achieve Goals: Good    Frequency Min 6X/week   Barriers to discharge Decreased caregiver support wife unable to lift patient    Co-evaluation               End of Session Equipment Utilized During Treatment: Gait belt Activity Tolerance: Patient limited by pain Patient left: in bed;with call bell/phone within reach;with bed alarm set;with family/visitor present Nurse Communication: Mobility status;Other (comment) (L foot numbness and weakness.)    Functional Assessment Tool Used: clinical judgement Functional Limitation: Mobility: Walking and moving around Mobility: Walking and Moving Around Current Status [id number]): 100 percent impaired, limited or restricted Mobility: Walking and Moving Around Goal Status [phone]): At least 1 percent but less than 20 percent impaired, limited or restricted    Time: 1115-1206 PT Time Calculation (min) (ACUTE ONLY): 51 min   Charges:   PT Evaluation $PT Eval Moderate Complexity: 1 Procedure PT Treatments $Therapeutic Activity: 8-22 mins $Self Care/Home Management: 8-22   PT G Codes:   PT G-Codes **NOT FOR INPATIENT CLASS** Functional Assessment Tool Used: clinical judgement Functional Limitation: Mobility: Walking and moving around  Mobility: Walking and Moving Around Current Status [phone]): 100 percent impaired, limited or restricted Mobility: Walking and Moving Around Goal Status [phone]): At least 1 percent but less than 20 percent impaired, limited or restricted    Claretha Cooper 03/04/2015, 1:11 PM  Tresa Endo PT [phone]

## 2015-03-05 ENCOUNTER — Encounter (HOSPITAL_COMMUNITY): Payer: Self-pay | Admitting: Internal Medicine

## 2015-03-05 DIAGNOSIS — S32009S Unspecified fracture of unspecified lumbar vertebra, sequela: Secondary | ICD-10-CM | POA: Diagnosis not present

## 2015-03-05 DIAGNOSIS — I10 Essential (primary) hypertension: Secondary | ICD-10-CM | POA: Diagnosis not present

## 2015-03-05 DIAGNOSIS — I739 Peripheral vascular disease, unspecified: Secondary | ICD-10-CM

## 2015-03-05 DIAGNOSIS — W009XXA Unspecified fall due to ice and snow, initial encounter: Secondary | ICD-10-CM | POA: Diagnosis not present

## 2015-03-05 DIAGNOSIS — S32008A Other fracture of unspecified lumbar vertebra, initial encounter for closed fracture: Secondary | ICD-10-CM | POA: Diagnosis not present

## 2015-03-05 HISTORY — DX: Peripheral vascular disease, unspecified: I73.9

## 2015-03-05 LAB — HEMOGLOBIN A1C
HEMOGLOBIN A1C: 10.1 % — AB (ref 4.8–5.6)
MEAN PLASMA GLUCOSE: 243 mg/dL

## 2015-03-05 LAB — GLUCOSE, CAPILLARY: Glucose-Capillary: 244 mg/dL — ABNORMAL HIGH (ref 65–99)

## 2015-03-05 MED ORDER — POLYETHYLENE GLYCOL 3350 17 G PO PACK: 17.0000 g | PACK | Freq: Every day | ORAL | Status: AC

## 2015-03-05 MED ORDER — BISACODYL 5 MG PO TBEC: 5.0000 mg | DELAYED_RELEASE_TABLET | Freq: Every day | ORAL | Status: AC | PRN

## 2015-03-05 MED ORDER — HYDROCODONE-ACETAMINOPHEN 5-325 MG PO TABS: 1.0000 | ORAL_TABLET | ORAL | Status: DC | PRN

## 2015-03-05 MED ORDER — CYCLOBENZAPRINE HCL 10 MG PO TABS: 10.0000 mg | ORAL_TABLET | Freq: Three times a day (TID) | ORAL | Status: AC | PRN

## 2015-03-05 NOTE — Progress Notes (Signed)
Spoke with MD/CSW all are on board with SNF transfer. However CSW is awaiting insurance approval. CM will continue to be available should disposition needs change.

## 2015-03-05 NOTE — Discharge Instructions (Signed)
Thoracolumbosacral Orthosis Brace °A thoracolumbosacral orthosis (TLSO) brace can be worn for different purposes. A TLSO brace can be worn to support the back. Your back may need more support if you had a broken bone in your back (fractured vertebrae), back surgery, or you cannot move (paralysis) your torso muscles. A TLSO brace can also be worn by a child to help prevent curving back problems from getting worse as the child grows. TLSO braces are used to limit bending and twisting. The braces are made from a hard plastic. They are custom fit for each wearer. The TLSO brace covers both the front and back of the torso. On the back, the brace reaches from just above the tailbone to just below the shoulder blades. On the front, the brace covers the ribs and often reaches past the waist and over the hips. The brace can be worn under clothing.  °HOME CARE INSTRUCTIONS °· Ask your caregiver if you can remove your brace when showering or sleeping. °· Follow your caregiver's instructions for putting on your brace. °· Follow your caregiver's instructions about how much weight you can lift. °· Always wear a T-shirt under the brace. °· Make sure the brace is always well-aligned and fits you closely. °· Check your skin daily for sores and redness caused by rubbing or pressure. °· If you feel unsteady, use a cane or walker for support until you feel more steady. °· Sit in high, firm chairs. It may be difficult to stand up from low, soft chairs. °· Keep all follow-up appointments as directed by your caregiver. °SEEK IMMEDIATE MEDICAL CARE IF: °· You have a fever. °· You have shortness of breath. °· You have chest pain. °· You have numbness, tingling, or pain. °Developed in conjunction with the [employer] at [facility]. °  °This information is not intended to replace advice given to you by your health care provider. Make sure you discuss any questions you have with your health care  provider. °  °Document Released: 01/27/2011 Document Revised: 05/02/2011 Document Reviewed: 01/27/2011 °[employer] Interactive Patient Education ©2016 [employer] ° °

## 2015-03-05 NOTE — Discharge Summary (Signed)
Physician Discharge Summary  [name] [id number] DOB: 1945-11-23 DOA: 03/03/2015  PCP: Stephens Shire, MD  Admit date: 03/03/2015 Discharge date: 03/05/2015   Recommendations for Outpatient Follow-Up:   1. The patient is being discharged to a rehab for further PT. 2. Monitor closely for recurrent urinary retention and I&O catheterize as needed. 3. F/U with Dr. Kathyrn Sheriff from neurosurgery who recommended lumbar corset brace, which is ordered for the patient. He recommends outpatient follow-up with him in 4 weeks.    Discharge Diagnosis:   Principal Problem:    Lumbar transverse process fracture of L2 and L3 (HCC) Active Problems:    Essential hypertension    Insulin dependent diabetes mellitus ([place])    Fall from slipping on ice    Headache, post-traumatic, acute    Fracture of lumbar spine without cord injury [facility])   Discharge disposition:  SNF: [facility].  Discharge Condition: Improved.  Diet recommendation: Low sodium, heart healthy.  Carbohydrate-modified.    History of Present Illness:   [name] is an 70 y.o. male with PMH of insulin-dependent diabetes mellitus, hypertension, and osteoarthritis status post left TKA who was admitted 03/03/15 following mechanical fall at home, workup was significant for acute fracture of L2 and L3 left transverse process, as well as right knee tiny bony densities noted adjacent to the superior lateral aspect of patella could represent small fracture chips. Neurosurgery recommended outpatient follow-up, patient was admitted for pain control and PT evaluation.  Hospital Course by Problem:   Principal Problem:  Lumbar transverse process fracture (HCC)/fracture of lumbar spine without cord injury secondary to fall from slipping on ice - No neurosurgical intervention planned. - Dr. Lajuana Ripple recommends acute treatment with lumbar corset brace and analgesia, then outpt follow-up.  - Left leg paraesthesias present:  No cord compression on CT, no evidence of unstable fracture on lumbar spine MRI. - Patient was seen by PT, they recommended SNF placement, social worker consulted.  Active Problems:  Acute urinary retention - I/O catheterized last night with 1200 mL return.   Essential hypertension - Monitor blood pressure. Not on medications. Blood pressure currently controlled.   Insulin dependent diabetes mellitus ([place]), uncontrolled - Managed with Glucophage (on hold) and Lantus at home. - Hemoglobin A1c 10.1 indicating suboptimal control. - Currently being managed with 50 units of Lantus daily and moderate scale SSI 3 times a day. CBGs 92-236.   Headache, post-traumatic, acute - Pain medications as needed.    Medical Consultants:   Dr. Kathyrn Sheriff: Neurosurgery   Discharge Exam:   Filed Vitals:   03/04/15 2215 03/05/15 0530  BP: 138/59 121/63  Pulse: 93 72  Temp: 98.8 F (37.1 C) 98.4 F (36.9 C)  Resp: 20 14   Filed Vitals:   03/03/15 2035 03/04/15 0518 03/04/15 2215 03/05/15 0530  BP: 128/73 141/70 138/59 121/63  Pulse: 95 86 93 72  Temp: 98.9 F (37.2 C) 98.5 F (36.9 C) 98.8 F (37.1 C) 98.4 F (36.9 C)  TempSrc: Oral Oral Oral Oral  Resp: 16 16 20 14   Height: 5\' 5"  (1.651 m)     Weight: 94.6 kg (208 lb 8.9 oz)     SpO2: 95% 97% 100% 96%    Gen:  NAD Cardiovascular:  RRR, No M/R/G Respiratory: Lungs CTAB Gastrointestinal: Abdomen soft, NT/ND with normal active bowel sounds. Extremities: No C/E/C   The results of significant diagnostics from this hospitalization (including imaging, microbiology, ancillary and laboratory) are listed below for reference.  Procedures and Diagnostic Studies:   Ct Head Wo Contrast  03/03/2015  CLINICAL DATA:  Fall on porch with head injury, headache and reported loss of consciousness. Initial encounter. EXAM: CT HEAD WITHOUT CONTRAST CT CERVICAL SPINE WITHOUT CONTRAST TECHNIQUE: Multidetector CT imaging of the head and cervical  spine was performed following the standard protocol without intravenous contrast. Multiplanar CT image reconstructions of the cervical spine were also generated. COMPARISON:  None. FINDINGS: CT HEAD FINDINGS The brain demonstrates no evidence of hemorrhage, infarction, edema, mass effect, extra-axial fluid collection, hydrocephalus or mass lesion. The skull is unremarkable. CT CERVICAL SPINE FINDINGS The cervical spine shows normal alignment. There is no evidence of acute fracture or subluxation. No soft tissue swelling or hematoma is identified. Moderate spondylosis present at the C4-5, C5-6 and C6-7 levels. No bony or soft tissue lesions are seen. The visualized airway is normally patent. IMPRESSION: 1. Normal head CT. 2. No acute cervical injury identified. Moderate multilevel spondylosis present of the cervical spine. Electronically Signed   By: Aletta Edouard M.D.   On: 03/03/2015 15:27   Ct Cervical Spine Wo Contrast  03/03/2015  CLINICAL DATA:  Fall on porch with head injury, headache and reported loss of consciousness. Initial encounter. EXAM: CT HEAD WITHOUT CONTRAST CT CERVICAL SPINE WITHOUT CONTRAST TECHNIQUE: Multidetector CT imaging of the head and cervical spine was performed following the standard protocol without intravenous contrast. Multiplanar CT image reconstructions of the cervical spine were also generated. COMPARISON:  None. FINDINGS: CT HEAD FINDINGS The brain demonstrates no evidence of hemorrhage, infarction, edema, mass effect, extra-axial fluid collection, hydrocephalus or mass lesion. The skull is unremarkable. CT CERVICAL SPINE FINDINGS The cervical spine shows normal alignment. There is no evidence of acute fracture or subluxation. No soft tissue swelling or hematoma is identified. Moderate spondylosis present at the C4-5, C5-6 and C6-7 levels. No bony or soft tissue lesions are seen. The visualized airway is normally patent. IMPRESSION: 1. Normal head CT. 2. No acute cervical  injury identified. Moderate multilevel spondylosis present of the cervical spine. Electronically Signed   By: Aletta Edouard M.D.   On: 03/03/2015 15:27   Mr Lumbar Spine Wo Contrast  03/04/2015  CLINICAL DATA:  Fall at home on ice, onto left side, transverse process fractures at L2 and L3 EXAM: MRI LUMBAR SPINE WITHOUT CONTRAST TECHNIQUE: Multiplanar, multisequence MR imaging of the lumbar spine was performed. No intravenous contrast was administered. COMPARISON:  03/03/2015 CT FINDINGS: Distended urinary bladder. The lowest lumbar type non-rib-bearing vertebra is labeled as L5. The conus medullaris appears normal. Conus level: T12-L1. Very subtly displaced acute left transverse process tip fractures at L2 and L3 with subtle adjacent edema in the adjacent soft tissues. No other fractures are identified. Small peripelvic cysts in the left kidney lower pole. No significant paraspinal or psoas hematoma. There is 3 mm of degenerative anterolisthesis at L5-S1. Additional findings at individual levels are as follows: L1-2:  Unremarkable. L2-3: Mild bilateral foraminal stenosis and mild displacement of both L2 nerves in the lateral extraforaminal space due to diffuse disc bulge. L3-4: Mild right and mild-to-moderate left foraminal stenosis with moderate left and mild right displacement of the L3 nerves in the lateral extraforaminal space, and mild left subarticular lateral recess stenosis, due to left eccentric disc bulge and left paracentral disc protrusion L4-5: Mild to moderate bilateral subarticular lateral recess stenosis with mild to moderate left and mild right foraminal stenosis and mild displacement of both L4 nerves in the lateral extraforaminal space as well  as borderline central narrowing of the thecal sac due to diffuse disc bulge, intervertebral spurring, and facet arthropathy. L5-S1: Mild left and borderline right foraminal stenosis due to disc bulge and small left foraminal disc protrusion. IMPRESSION:  1. Minimally displaced left transverse process tip fractures at L2 and L3. These are not unstable fractures. Mild surrounding soft tissue edema without significant hematoma in the vicinity of these fractures. No other fractures in the lumbar spine. 2. Lumbar spondylosis and degenerative disc disease cause mild to moderate impingement at L4-5 and mild impingement at L2-3, L3-4, and L5-S1, as detailed above. Electronically Signed   By: Van Clines M.D.   On: 03/04/2015 14:03   Ct Abdomen Pelvis W Contrast  03/03/2015  CLINICAL DATA:  Fall today with left flank and hip pain. EXAM: CT ABDOMEN AND PELVIS WITH CONTRAST TECHNIQUE: Multidetector CT imaging of the abdomen and pelvis was performed using the standard protocol following bolus administration of intravenous contrast. CONTRAST:  156mL OMNIPAQUE IOHEXOL 300 MG/ML  SOLN COMPARISON:  None. FINDINGS: Lower chest and abdominal wall: Wide necked fatty umbilical hernia. There changes of low right abdominal wall hernia and repair. Fatty enlargement of the bilateral inguinal canal, left more than right. Subcutaneous reticulation in the lower abdominal wall compatible with medication injection sites. Hepatobiliary: Prominent caudate lobe and central fissures but no definitive surface changes of cirrhosis.No evidence of biliary obstruction or stone. Pancreas: Unremarkable. Spleen: Unremarkable. Adrenals/Urinary Tract: Negative adrenals. 4 mm left renal calculus. No hydronephrosis or ureteral stone. Small renal hilar cyst on the right. Unremarkable bladder. Reproductive:Mild prostate enlargement deforming the bladder base. Stomach/Bowel:  No obstruction. Appendectomy. Vascular/Lymphatic: No acute vascular abnormality. No mass or adenopathy. Peritoneal: No ascites or pneumoperitoneum. Musculoskeletal: L2 and L3 left transverse process fractures with mild distraction but no displacement. Bulky lumbar and lower thoracic spondylosis. IMPRESSION: 1. L2 and L3 left  transverse process fractures, nondisplaced. 2. No evidence of intra-abdominal injury. 3. Left nephrolithiasis and other incidental findings are described above. Electronically Signed   By: Monte Fantasia M.D.   On: 03/03/2015 17:22   Dg Knee Complete 4 Views Left  03/04/2015  CLINICAL DATA:  [place] yesterday and injured left knee. History of total knee arthroplasty. EXAM: LEFT KNEE - COMPLETE 4+ VIEW COMPARISON:  Radiographs 06/14/2012 FINDINGS: The femoral and tibial components are well seated. No complicating features. No acute fracture. No joint effusion. Extensive vascular calcifications. IMPRESSION: No acute bony findings or complicating features associated with the total left knee arthroplasty. No joint effusion. Electronically Signed   By: Marijo Sanes M.D.   On: 03/04/2015 16:39   Dg Knee Complete 4 Views Right  03/03/2015  CLINICAL DATA:  Fall.  Pain.  Initial evaluation. EXAM: RIGHT KNEE - COMPLETE 4+ VIEW COMPARISON:  No prior. FINDINGS: Small knee joint effusion cannot be excluded. Bipartite patella noted. Small bony densities noted adjacent to the of superior lateral aspect patella could represent old fracture fragments. Tricompartment degenerative change. Degenerative changes most prominent about the medial compartment. Peripheral vascular calcification. IMPRESSION: 1. Bipartite patella. Tiny bony densities noted adjacent to the superior lateral aspect of patella could represent small fracture chips. 2. Severe tricompartment degenerative change. Degenerative changes most prominent about the medial compartment. 3. Small knee joint effusion cannot be excluded. 4. Peripheral vascular disease. Electronically Signed   By: Marcello Moores  Register   On: 03/03/2015 15:00   Dg Hip Unilat W Or W/o Pelvis 2-3 Views Left  03/03/2015  CLINICAL DATA:  Fall.  Initial evaluation . EXAM: DG  HIP (WITH OR WITHOUT PELVIS) 2-3V LEFT COMPARISON:  None. FINDINGS: Degenerative changes lumbar spine and both hips. No acute  bony or joint abnormality identified. No evidence fracture dislocation. Vascular calcification noted. Surgical clip right pelvis . IMPRESSION: 1. Degenerative changes lumbar spine and both hips. No acute bony abnormality. 2.  Peripheral vascular disease . Electronically Signed   By: Marcello Moores  Register   On: 03/03/2015 14:57     Labs:   Basic Metabolic Panel:  Recent Labs Lab 03/03/15 1625  NA 140  K 4.2  CL 103  GLUCOSE 188*  BUN 20  CREATININE 1.20   GFR Estimated Creatinine Clearance: 60.5 mL/min (by C-G formula based on Cr of 1.2). Liver Function Tests: No results for input(s): AST, ALT, ALKPHOS, BILITOT, PROT, ALBUMIN in the last 168 hours. No results for input(s): LIPASE, AMYLASE in the last 168 hours. No results for input(s): AMMONIA in the last 168 hours. Coagulation profile  Recent Labs Lab 03/03/15 1953  INR 1.00    CBC:  Recent Labs Lab 03/03/15 1625 03/03/15 1953  WBC  --  11.6*  HGB 16.7 16.6  HCT 49.0 47.9  MCV  --  90.9  PLT  --  233   Cardiac Enzymes: No results for input(s): CKTOTAL, CKMB, CKMBINDEX, TROPONINI in the last 168 hours. BNP: Invalid input(s): POCBNP CBG:  Recent Labs Lab 03/03/15 2216 03/04/15 0758 03/04/15 1200 03/04/15 2218  GLUCAP 117* 92 156* 236*   D-Dimer No results for input(s): DDIMER in the last 72 hours. Hgb A1c  Recent Labs  03/03/15 1953  HGBA1C 10.1*   Lipid Profile No results for input(s): CHOL, HDL, LDLCALC, TRIG, CHOLHDL, LDLDIRECT in the last 72 hours. Thyroid function studies No results for input(s): TSH, T4TOTAL, T3FREE, THYROIDAB in the last 72 hours.  Invalid input(s): FREET3 Anemia work up No results for input(s): VITAMINB12, FOLATE, FERRITIN, TIBC, IRON, RETICCTPCT in the last 72 hours. Microbiology No results found for this or any previous visit (from the past 240 hour(s)).   Discharge Instructions:   Discharge Instructions    Call MD for:  extreme fatigue    Complete by:  As directed        Call MD for:  severe uncontrolled pain    Complete by:  As directed      Diet - low sodium heart healthy    Complete by:  As directed      Diet Carb Modified    Complete by:  As directed      Increase activity slowly    Complete by:  As directed      Walk with assistance    Complete by:  As directed      Walker     Complete by:  As directed             Medication List    TAKE these medications        aspirin EC 81 MG tablet  Take 81 mg by mouth daily.     bisacodyl 5 MG EC tablet  Commonly known as:  DULCOLAX  Take 1 tablet (5 mg total) by mouth daily as needed for moderate constipation.     bismuth subsalicylate [id number] suspension  Commonly known as:  PEPTO BISMOL  Take 30 mLs by mouth every 6 (six) hours as needed for indigestion or diarrhea or loose stools.     CENTRUM SILVER ADULT 50+ Tabs  Take 1 tablet by mouth daily.     cyclobenzaprine 10 MG  tablet  Commonly known as:  FLEXERIL  Take 1 tablet (10 mg total) by mouth 3 (three) times daily as needed for muscle spasms.     HYDROcodone-acetaminophen 5-325 MG tablet  Commonly known as:  NORCO/VICODIN  Take 1-2 tablets by mouth every 4 (four) hours as needed for moderate pain.     ibuprofen 200 MG tablet  Commonly known as:  ADVIL,MOTRIN  Take 800 mg by mouth every 6 (six) hours as needed for moderate pain.     insulin glargine 100 UNIT/ML injection  Commonly known as:  LANTUS  Inject 60-65 Units into the skin 2 (two) times daily. Takes 65 in the morning and 60 in the evening     metFORMIN 1000 MG tablet  Commonly known as:  GLUCOPHAGE  Take 1,000 mg by mouth 2 (two) times daily with a meal.     polyethylene glycol packet  Commonly known as:  MIRALAX / GLYCOLAX  Take 17 g by mouth daily.           Follow-up Information    Follow up with [facility].   Specialty:  [place] information:   [place] [phone]       Schedule an appointment as soon as possible for a visit with Stephens Shire, MD.   Specialty:  Family Medicine   Why:  If symptoms worsen   Contact information:   [address] [place] [address] [phone]        Time coordinating discharge: 35 minutes.  Signed:  RAMA,CHRISTINA  Pager [phone] [facility] 03/05/2015, 12:48 PM

## 2015-03-05 NOTE — Evaluation (Signed)
Occupational Therapy Evaluation Patient Details Name: [name] MRN: [id number] DOB: 1945-04-15 Today's Date: 03/05/2015    History of Present Illness [name] is a 70 y.o. male with PMH of insulin-dependent diabetes mellitus, hypertension, and osteoarthritis status post left TKA who presents to the ED 03/03/15  with EMS following a mechanical fall at home. On the evening of 03/03/2015 and slipped on ice, falling onto his left side and then striking the back of his head on the ground. There was a brief loss of consciousness and immediate, severe pain at the left hip and right knee. When his wife approached him, he was reportedly alert with no appreciable confusion or disorientation. He was unable to get up off the ground due to pain and EMS was activated. CT pelvis demonstrated acute, nondisplaced fractures of the left transverse processes at L2 and L3.Neurosurgery consulted and recommended F/U as OP. ordered lumbar corset.   Clinical Impression   This 70 year old man was admitted for a fall resulting in L2-L3 nondisplaced fxs of L transverse processes.  He will benefit from skilled OT to increase safety and independence with adls.  Pt currently needs up to total A +2 for LB adls.  Goals in acute are for mod +2 assistance    Follow Up Recommendations  SNF    Equipment Recommendations  3 in 1 bedside comode    Recommendations for Other Services       Precautions / Restrictions Precautions Precautions: Back Precaution Comments: no orders to clarfy when to place bbrace. placed in supine. Spinal Brace: Lumbar corset Restrictions Weight Bearing Restrictions: No      Mobility Bed Mobility   Bed Mobility: Rolling;Sidelying to Sit;Sit to Sidelying Rolling: Max assist;+2 for safety/equipment Sidelying to sit: Max assist;+2 for physical assistance       General bed mobility comments: from sidelying, raise HOB to assist with lifting. Assist with LEs and trunk  Transfers    Equipment used: Rolling walker (2 wheeled) Transfers: Sit to/from Stand Sit to Stand: Max assist;+2 physical assistance;+2 safety/equipment;From elevated surface         General transfer comment: assist to power up and stabilize.  Bed elevated    Balance   Sitting-balance support: Bilateral upper extremity supported;Feet supported Sitting balance-Leahy Scale: Fair     Standing balance support: Bilateral upper extremity supported Standing balance-Leahy Scale: Poor                              ADL Overall ADL's : Needs assistance/impaired     Grooming: Set up;Sitting   Upper Body Bathing: Set up;Sitting   Lower Body Bathing: Maximal assistance;+2 for physical assistance;Sit to/from stand   Upper Body Dressing : Set up;Sitting   Lower Body Dressing: Total assistance;+2 for physical assistance;Sit to/from stand                 General ADL Comments: educated on AE and back precautions but did not use AE this session.  Pt took a few steps forward with +2 assistance, L knee buckling at times (see PT note).  Brought chair up behind him.  Pt needs support of bil UEs to stand with RW     Vision     Perception     Praxis      Pertinent Vitals/Pain Pain Score: 7  Pain Location: back Pain Descriptors / Indicators: Aching Pain Intervention(s): Limited activity within patient's tolerance;Monitored during session;Premedicated before session;Repositioned  Hand Dominance     Extremity/Trunk Assessment Upper Extremity Assessment Upper Extremity Assessment: Overall WFL for tasks assessed (functionally; MMT deferred 2* back pain)           Communication Communication Communication: No difficulties   Cognition Arousal/Alertness: Awake/alert Behavior During Therapy: WFL for tasks assessed/performed;Anxious Overall Cognitive Status: Within Functional Limits for tasks assessed                     General Comments       Exercises        Shoulder Instructions      Home Living Family/patient expects to be discharged to:: Skilled nursing facility                                        Prior Functioning/Environment Level of Independence: Independent             OT Diagnosis: Acute pain   OT Problem List: Decreased strength;Decreased activity tolerance;Pain;Impaired balance (sitting and/or standing);Decreased knowledge of use of DME or AE;Decreased knowledge of precautions;Impaired sensation (LE sensation)   OT Treatment/Interventions: Self-care/ADL training;DME and/or AE instruction;Therapeutic activities;Patient/family education;Balance training    OT Goals(Current goals can be found in the care plan section) Acute Rehab OT Goals Patient Stated Goal: I want to get better. OT Goal Formulation: With patient Time For Goal Achievement: 03/12/15 Potential to Achieve Goals: Good ADL Goals Pt Will Perform Lower Body Bathing: with mod assist;with adaptive equipment;sit to/from stand (+2) Pt Will Perform Lower Body Dressing: with mod assist;with adaptive equipment;sit to/from stand (+2) Pt Will Transfer to Toilet: with mod assist;with +2 assist;stand pivot transfer;bedside commode  OT Frequency: Min 2X/week   Barriers to D/C:            Co-evaluation PT/OT/SLP Co-Evaluation/Treatment: Yes Reason for Co-Treatment: For patient/therapist safety PT goals addressed during session: Mobility/safety with mobility OT goals addressed during session: ADL's and self-care      End of Session    Activity Tolerance: Patient limited by fatigue Patient left: in chair;with call bell/phone within reach   Time: [id number] OT Time Calculation (min): 20 min Charges:  OT General Charges $OT Visit: 1 Procedure OT Evaluation $OT Eval Low Complexity: 1 Procedure G-Codes: OT G-codes **NOT FOR INPATIENT CLASS** Functional Assessment Tool Used: clinical judgment Functional Limitation: Self care Self Care Current  Status [id number]): At least 80 percent but less than 100 percent impaired, limited or restricted Self Care Goal Status [id number]): At least 40 percent but less than 60 percent impaired, limited or restricted  [name] 03/05/2015, 10:14 AM   [name], OTR/L [phone] 03/05/2015

## 2015-03-05 NOTE — Progress Notes (Signed)
Physical Therapy Treatment Patient Details Name: [name] MRN: [id number] DOB: [birth year] Today's Date: 03/05/2015    History of Present Illness [name] is a 70 y.o. male with PMH of iDM, hypertension, post left TKA who presents to the ED 03/03/15  with EMS following a slip on ice,  Per wife, there  was a brief loss of consciousness and immediate, severe pain at the left hip and right knee. No apparant confusion when he came to per wife.  CT pelvis demonstrated acute, nondisplaced fractures of the left transverse processes at L2 and L3.Neurosurgery consulted and recommended F/U as OP.  Ordered lumbar corset.Marland Kitchen MRI demonstrates Lumbar spondylosis and degenerative disc disease  with mild to moderate impingenment L4-5,  mild impingement l2-3, L 3-4, L5-S1, xray L knee show small bony fragments next to patella, suggestive of possible fracture.    PT Comments    Patient is slowly progressing, to rehab snf today.  Follow Up Recommendations  SNF;Supervision/Assistance - 24 hour     Equipment Recommendations  None recommended by PT    Recommendations for Other Services       Precautions / Restrictions Precautions Precautions: Back;Fall Precaution Comments: no orders to clarfy when to place brace. placed in supine. Required Braces or Orthoses: Spinal Brace Spinal Brace: Lumbar corset Restrictions Weight Bearing Restrictions: No    Mobility  Bed Mobility   Bed Mobility: Rolling;Sidelying to Sit;Sit to Sidelying Rolling: Max assist;+2 for safety/equipment Sidelying to sit: Max assist;+2 for physical assistance       General bed mobility comments: from sidelying, raise HOB to assist with lifting. Assist with LEs and trunk  Transfers Overall transfer level: Needs assistance Equipment used: Rolling walker (2 wheeled) Transfers: Sit to/from Stand Sit to Stand: Max assist;+2 physical assistance;+2 safety/equipment         General transfer comment: stood from recliner with  assist to power up to standing with 2 person assist.. stood x 2, once to urinate with assist.  Ambulation/Gait   Stairs            Wheelchair Mobility    Modified Rankin (Stroke Patients Only)       Balance   Sitting-balance support: Bilateral upper extremity supported;Feet supported Sitting balance-Leahy Scale: Fair     Standing balance support: Bilateral upper extremity supported Standing balance-Leahy Scale: Poor                      Cognition Arousal/Alertness: Awake/alert Behavior During Therapy: WFL for tasks assessed/performed;Anxious Overall Cognitive Status: Within Functional Limits for tasks assessed                      Exercises      General Comments        Pertinent Vitals/Pain Pain Score: 6  Pain Location: back and L leg Pain Descriptors / Indicators: Aching;Cramping;Discomfort;Grimacing Pain Intervention(s): Limited activity within patient's tolerance;Monitored during session;Premedicated before session    Home Living Family/patient expects to be discharged to:: Skilled nursing facility                    Prior Function Level of Independence: Independent          PT Goals (current goals can now be found in the care plan section) Acute Rehab PT Goals Patient Stated Goal: I want to get better. Progress towards PT goals: Progressing toward goals    Frequency  Min 6X/week    PT Plan Current plan remains  appropriate    Co-evaluation PT/OT/SLP Co-Evaluation/Treatment: Yes Reason for Co-Treatment: Complexity of the patient's impairments (multi-system involvement);For patient/therapist safety PT goals addressed during session: Mobility/safety with mobility OT goals addressed during session: ADL's and self-care     End of Session Equipment Utilized During Treatment: Back brace Activity Tolerance: Patient tolerated treatment well Patient left: in chair;with call bell/phone within reach;with nursing/sitter in  room;with family/visitor present     Time: [id number] PT Time Calculation (min) (ACUTE ONLY): 18 min  Charges:  $Gait Training: 8-22 mins $Therapeutic Activity: 8-22 mins                    G Codes:      Claretha Cooper 03/05/2015, 1:55 PM

## 2015-03-05 NOTE — Clinical Social Work Placement (Signed)
CSW confirmed with Lattie Haw at [facility] that they were able to offer a bed - just awaiting [facility] insurance authorization. Patient aware.     Raynaldo Opitz, [facility] Clinical Social Worker cell #: [phone]    CLINICAL SOCIAL WORK PLACEMENT  NOTE  Date:  03/05/2015  Patient Details  Name: [name] MRN: [id number] Date of Birth: 1945-04-01  Clinical Social Work is seeking post-discharge placement for this patient at the [place] level of care (*CSW will initial, date and re-position this form in  chart as items are completed):  Yes   Patient/family provided with [place] Work Department's list of facilities offering this level of care within the geographic area requested by the patient (or if unable, by the patient's family).  Yes   Patient/family informed of their freedom to choose among providers that offer the needed level of care, that participate in Medicare, Medicaid or managed care program needed by the patient, have an available bed and are willing to accept the patient.  Yes   Patient/family informed of [place]'s ownership interest in [facility] and [facility] as well as of the fact that they are under no obligation to receive care at these facilities.  PASRR submitted to EDS on 02/25/15     PASRR number received on 03/04/15     Existing PASRR number confirmed on       FL2 transmitted to all facilities in geographic area requested by pt/family on 03/04/15     FL2 transmitted to all facilities within larger geographic area on       Patient informed that his/her managed care company has contracts with or will negotiate with certain facilities, including the following:        Yes   Patient/family informed of bed offers received.  Patient chooses bed at [facility]     Physician recommends and patient chooses bed at      Patient to be transferred to [facility] on   .  Patient to be transferred to facility by       Patient family notified on   of transfer.  Name of family member notified:        PHYSICIAN       Additional Comment:    _______________________________________________ Standley Brooking, LCSW 03/05/2015, 8:58 AM

## 2015-03-05 NOTE — Progress Notes (Signed)
CSW spoke with [facility] HMO case manager, Nelida Meuse (ph#: [phone]) who states that they have received all the clinical information needed and will get to reviewing when they are able - hopeful to have authorization if not this afternoon, then in the morning. [place] aware.    Raynaldo Opitz, [facility] Clinical Social Worker cell #: [phone]

## 2015-03-05 NOTE — Progress Notes (Signed)
Physical Therapy Treatment Patient Details Name: [name] MRN: [id number] DOB: 1945/04/08 Today's Date: 03/05/2015    History of Present Illness [name] is a 70 y.o. male with PMH of iDM, hypertension, post left TKA who presents to the ED 03/03/15  with EMS following a slip on ice,  Per wife, there  was a brief loss of consciousness and immediate, severe pain at the left hip and right knee. No apparant confusion when he came to per wife.  CT pelvis demonstrated acute, nondisplaced fractures of the left transverse processes at L2 and L3.Neurosurgery consulted and recommended F/U as OP.  Ordered lumbar corset.Marland Kitchen MRI demonstrates Lumbar spondylosis and degenerative disc disease  with mild to moderate impingenment L4-5,  mild impingement l2-3, L 3-4, L5-S1, xray L knee show small bony fragments next to patella, suggestive of possible fracture.    PT Comments    Patient stated" My left leg feels like it is asleep". Patient demonstrates decreased  Strength of dorsiflexion, knee extension, noted L knee buckling when standing. Pain increases with mobility but does participate and  Works through the pain.  Follow Up Recommendations  SNF;Supervision/Assistance - 24 hour     Equipment Recommendations  None recommended by PT    Recommendations for Other Services       Precautions / Restrictions Precautions Precautions: Back Precaution Comments: no orders to clarfy when to place brace. placed in supine. Required Braces or Orthoses: Spinal Brace Spinal Brace: Lumbar corset Restrictions Weight Bearing Restrictions: No    Mobility  Bed Mobility   Bed Mobility: Rolling;Sidelying to Sit;Sit to Sidelying Rolling: Max assist;+2 for safety/equipment Sidelying to sit: Max assist;+2 for physical assistance       General bed mobility comments: from sidelying, raise HOB to assist with lifting. Assist with LEs and trunk  Transfers   Equipment used: Rolling walker (2 wheeled) Transfers:  Sit to/from Stand Sit to Stand: Max assist;+2 physical assistance;+2 safety/equipment;From elevated surface         General transfer comment: assist to power up and stabilize.  Bed elevated  Ambulation/Gait Ambulation/Gait assistance: Max assist;+2 physical assistance;+2 safety/equipment Ambulation Distance (Feet): 5 Feet Assistive device: Rolling walker (2 wheeled) Gait Pattern/deviations: Step-to pattern;Decreased step length - right;Decreased dorsiflexion - left     General Gait Details: L knee buckles with weight, decreased ability to advance the L leg, noted decreased dorsiflexion on the left, increased pain on the L side of back, reports shooting down th L eft leg.    Stairs            Wheelchair Mobility    Modified Rankin (Stroke Patients Only)       Balance   Sitting-balance support: Bilateral upper extremity supported;Feet supported Sitting balance-Leahy Scale: Fair     Standing balance support: Bilateral upper extremity supported Standing balance-Leahy Scale: Poor                      Cognition Arousal/Alertness: Awake/alert Behavior During Therapy: WFL for tasks assessed/performed;Anxious Overall Cognitive Status: Within Functional Limits for tasks assessed                      Exercises      General Comments        Pertinent Vitals/Pain Pain Score: 7  Pain Location: back and down the L leg. Pain Descriptors / Indicators: Aching;Grimacing;Shooting;Spasm Pain Intervention(s): Limited activity within patient's tolerance;Premedicated before session;Repositioned    Home Living Family/patient expects to be  discharged to:: Skilled nursing facility                    Prior Function Level of Independence: Independent          PT Goals (current goals can now be found in the care plan section) Acute Rehab PT Goals Patient Stated Goal: I want to get better. Progress towards PT goals: Progressing toward goals     Frequency  Min 6X/week    PT Plan Current plan remains appropriate    Co-evaluation PT/OT/SLP Co-Evaluation/Treatment: Yes Reason for Co-Treatment: Complexity of the patient's impairments (multi-system involvement);For patient/therapist safety PT goals addressed during session: Mobility/safety with mobility OT goals addressed during session: ADL's and self-care     End of Session Equipment Utilized During Treatment: Gait belt Activity Tolerance: Patient limited by pain;Treatment limited secondary to medical complications (Comment) (L leg weakness, decreased support.) Patient left: in chair;with call bell/phone within reach     Time: 0936-1000 PT Time Calculation (min) (ACUTE ONLY): 24 min  Charges:  $Gait Training: 8-22 mins                    G Codes:      Claretha Cooper 03/05/2015, 10:26 AM Tresa Endo PT [phone]

## 2015-03-05 NOTE — Progress Notes (Signed)
03/05/15 Nursing 0[address] K[address] Schoor notified of i/out cath at this time for 1200 cc. No orders recieved

## 2015-03-05 NOTE — Progress Notes (Signed)
No report on discharge tonight from [place]

## 2015-03-06 DIAGNOSIS — S32009S Unspecified fracture of unspecified lumbar vertebra, sequela: Secondary | ICD-10-CM

## 2015-03-06 LAB — GLUCOSE, CAPILLARY
Glucose-Capillary: 137 mg/dL — ABNORMAL HIGH (ref 65–99)
Glucose-Capillary: 178 mg/dL — ABNORMAL HIGH (ref 65–99)
Glucose-Capillary: 91 mg/dL (ref 65–99)

## 2015-03-06 MED ORDER — HYDROCODONE-ACETAMINOPHEN 5-325 MG PO TABS: 1.0000 | ORAL_TABLET | ORAL | Status: AC | PRN

## 2015-03-06 NOTE — Progress Notes (Signed)
Pt seen and examined, no changes from DC summary 1/12 Was able to urinate without difficulty, for Dc to Rehab today  Domenic Polite, MD

## 2015-03-06 NOTE — Progress Notes (Signed)
Attempted to call report to [facility].

## 2015-03-06 NOTE — Progress Notes (Signed)
[facility] Medicare HMO insurance authorization obtained (#: [id number]). Patient is set to discharge to [facility] SNF today.  CSW awaiting call back from [facility] that the room is ready.    Raynaldo Opitz, [facility] Clinical Social Worker cell #: [phone]

## 2015-03-06 NOTE — Progress Notes (Signed)
Attempted to call report again to [facility], was put on hold once again, waited for five minutes.

## 2015-03-06 NOTE — Progress Notes (Signed)
Called report to [place] at [facility].

## 2015-03-06 NOTE — Progress Notes (Signed)
Physical Therapy Treatment Patient Details Name: [name] MRN: [id number] DOB: [birth year] Today's Date: 03/06/2015    History of Present Illness [name] is a 70 y.o. male with PMH of iDM, hypertension, post left TKA who presents to the ED 03/03/15  with EMS following a slip on ice,  Per wife, there  was a brief loss of consciousness and immediate, severe pain at the left hip and right knee. No apparant confusion when he came to per wife.  CT pelvis demonstrated acute, nondisplaced fractures of the left transverse processes at L2 and L3.Neurosurgery consulted and recommended F/U as OP.  Ordered lumbar corset.Marland Kitchen MRI demonstrates Lumbar spondylosis and degenerative disc disease  with mild to moderate impingenment L4-5,  mild impingement l2-3, L 3-4, L5-S1, xray L knee show small bony fragments next to patella, suggestive of possible fracture.    PT Comments    Applied back brace supine "Log Rolling" side to side.  Assisted OOB to amb to and from bathroom.  Assisted back to bed per pt request.  Follow Up Recommendations  SNF     Equipment Recommendations  None recommended by PT    Recommendations for Other Services       Precautions / Restrictions Precautions Precautions: Back;Fall Precaution Comments: no orders to clarfy when to place brace. placed in supine. Required Braces or Orthoses: Spinal Brace Spinal Brace: Lumbar corset;Applied in supine position Restrictions Weight Bearing Restrictions: No    Mobility  Bed Mobility Overal bed mobility: Needs Assistance Bed Mobility: Rolling;Sidelying to Sit Rolling: Mod assist;+2 for safety/equipment;+2 for physical assistance Sidelying to sit: +2 for physical assistance;+2 for safety/equipment;Max assist     Sit to sidelying: Total assist;+2 for physical assistance;+2 for safety/equipment;HOB elevated General bed mobility comments: 75% VC's on proper "Log Roll" tech.  Increased time.  Transfers Overall transfer level: Needs  assistance Equipment used: Rolling walker (2 wheeled) Transfers: Sit to/from Stand Sit to Stand: +2 physical assistance;+2 safety/equipment;Min assist         General transfer comment: increased time and 25% VC's on proper tech  Ambulation/Gait Ambulation/Gait assistance: +2 safety/equipment;Min assist Ambulation Distance (Feet): 18 Feet (9 feet x 2 to and from bathroom) Assistive device: Rolling walker (2 wheeled) Gait Pattern/deviations: Step-to pattern;Decreased stance time - left;Trunk flexed     General Gait Details: 75% VC's on upright posture and proper walker to self distance.  Decreased WBing tolerance thru L LE due to radiating pain.   Stairs            Wheelchair Mobility    Modified Rankin (Stroke Patients Only)       Balance                                    Cognition                            Exercises      General Comments        Pertinent Vitals/Pain Pain Assessment: 0-10 Pain Score: 8  Pain Location: back and L buttock Pain Descriptors / Indicators: Constant;Grimacing Pain Intervention(s): Monitored during session;Repositioned    Home Living                      Prior Function            PT Goals (current goals can now be  found in the care plan section) Progress towards PT goals: Progressing toward goals    Frequency  Min 6X/week    PT Plan Current plan remains appropriate    Co-evaluation             End of Session Equipment Utilized During Treatment: Back brace Activity Tolerance: Patient limited by pain Patient left: in bed;with call bell/phone within reach     Time: 0925-0950 PT Time Calculation (min) (ACUTE ONLY): 25 min  Charges:  $Gait Training: 8-22 mins $Therapeutic Activity: 8-22 mins                    G Codes:      Rica Koyanagi  PTA [facility]  Acute  Rehab Pager      [phone]

## 2017-03-29 ENCOUNTER — Other Ambulatory Visit: Payer: Self-pay | Admitting: Physician Assistant

## 2017-03-29 ENCOUNTER — Ambulatory Visit
Admission: RE | Admit: 2017-03-29 | Discharge: 2017-03-29 | Disposition: A | Discharge status: Home / Self Care | Payer: Medicare HMO | Source: Ambulatory Visit | Attending: Physician Assistant | Admitting: Physician Assistant

## 2017-03-29 DIAGNOSIS — R059 Cough, unspecified: Secondary | ICD-10-CM

## 2017-03-29 DIAGNOSIS — R05 Cough: Secondary | ICD-10-CM

## 2017-03-31 ENCOUNTER — Inpatient Hospital Stay
Admission: AD | Admit: 2017-03-31 | Payer: Self-pay | Source: Other Acute Inpatient Hospital | Admitting: Internal Medicine

## 2017-10-26 ENCOUNTER — Encounter (HOSPITAL_COMMUNITY): Payer: Medicare HMO

## 2017-10-27 ENCOUNTER — Encounter (HOSPITAL_COMMUNITY)
Admission: RE | Admit: 2017-10-27 | Discharge: 2017-10-27 | Disposition: A | Discharge status: Home / Self Care | Payer: Medicare HMO | Source: Ambulatory Visit | Attending: Cardiology | Admitting: Cardiology

## 2024-01-12 ENCOUNTER — Ambulatory Visit: Attending: Internal Medicine | Admitting: Internal Medicine

## 2024-01-15 ENCOUNTER — Encounter: Payer: Self-pay | Admitting: Internal Medicine
